# Patient Record
Sex: Female | Born: 1935 | Race: White | Hispanic: No | Smoking: Former smoker
Health system: Southern US, Community
[De-identification: ages and names within clinical notes are randomized; demographics above are authoritative.]

## PROBLEM LIST (undated history)

## (undated) DIAGNOSIS — L0390 Cellulitis, unspecified: Secondary | ICD-10-CM

## (undated) DIAGNOSIS — I10 Essential (primary) hypertension: Secondary | ICD-10-CM

## (undated) DIAGNOSIS — K219 Gastro-esophageal reflux disease without esophagitis: Secondary | ICD-10-CM

## (undated) DIAGNOSIS — I2510 Atherosclerotic heart disease of native coronary artery without angina pectoris: Secondary | ICD-10-CM

## (undated) DIAGNOSIS — I509 Heart failure, unspecified: Secondary | ICD-10-CM

## (undated) HISTORY — DX: Cellulitis, unspecified: L03.90

## (undated) HISTORY — PX: RIB FRACTURE SURGERY: SHX2358

## (undated) HISTORY — PX: CORONARY ARTERY BYPASS GRAFT: SHX141

---

## 1999-01-27 ENCOUNTER — Encounter: Admission: RE | Admit: 1999-01-27 | Discharge: 1999-03-23 | Payer: Self-pay | Admitting: Internal Medicine

## 1999-03-24 ENCOUNTER — Encounter: Admission: RE | Admit: 1999-03-24 | Discharge: 1999-06-22 | Payer: Self-pay | Admitting: Internal Medicine

## 1999-08-10 ENCOUNTER — Encounter: Admission: RE | Admit: 1999-08-10 | Discharge: 1999-11-06 | Payer: Self-pay | Admitting: Orthopedic Surgery

## 1999-11-06 ENCOUNTER — Encounter: Admission: RE | Admit: 1999-11-06 | Discharge: 2000-02-04 | Payer: Self-pay | Admitting: Orthopedic Surgery

## 2000-06-01 ENCOUNTER — Encounter: Admission: RE | Admit: 2000-06-01 | Discharge: 2000-08-30 | Payer: Self-pay | Admitting: Internal Medicine

## 2000-09-05 ENCOUNTER — Encounter: Admission: RE | Admit: 2000-09-05 | Discharge: 2000-12-04 | Payer: Self-pay | Admitting: Orthopedic Surgery

## 2001-01-24 ENCOUNTER — Encounter: Admission: RE | Admit: 2001-01-24 | Discharge: 2001-01-30 | Payer: Self-pay | Admitting: Orthopedic Surgery

## 2001-05-02 ENCOUNTER — Encounter: Admission: RE | Admit: 2001-05-02 | Discharge: 2001-05-15 | Payer: Self-pay | Admitting: Orthopedic Surgery

## 2001-08-08 ENCOUNTER — Encounter: Admission: RE | Admit: 2001-08-08 | Discharge: 2001-08-28 | Payer: Self-pay | Admitting: Orthopedic Surgery

## 2005-05-03 ENCOUNTER — Other Ambulatory Visit: Payer: Self-pay

## 2005-05-03 ENCOUNTER — Inpatient Hospital Stay: Payer: Self-pay | Admitting: Internal Medicine

## 2005-09-01 ENCOUNTER — Encounter (HOSPITAL_BASED_OUTPATIENT_CLINIC_OR_DEPARTMENT_OTHER): Admission: RE | Admit: 2005-09-01 | Discharge: 2005-10-21 | Payer: Self-pay | Admitting: Surgery

## 2005-09-09 ENCOUNTER — Inpatient Hospital Stay: Payer: Self-pay | Admitting: Internal Medicine

## 2005-09-09 ENCOUNTER — Other Ambulatory Visit: Payer: Self-pay

## 2005-09-10 ENCOUNTER — Other Ambulatory Visit: Payer: Self-pay

## 2005-10-05 ENCOUNTER — Ambulatory Visit: Payer: Self-pay | Admitting: Internal Medicine

## 2007-07-04 ENCOUNTER — Ambulatory Visit: Payer: Self-pay | Admitting: Family

## 2007-12-07 ENCOUNTER — Ambulatory Visit: Payer: Self-pay | Admitting: Family

## 2008-03-07 ENCOUNTER — Ambulatory Visit: Payer: Self-pay | Admitting: Family

## 2008-11-28 ENCOUNTER — Ambulatory Visit: Payer: Self-pay | Admitting: Family

## 2009-03-04 ENCOUNTER — Ambulatory Visit: Payer: Self-pay | Admitting: Family

## 2009-05-23 ENCOUNTER — Ambulatory Visit: Payer: Self-pay | Admitting: Family

## 2009-12-10 ENCOUNTER — Ambulatory Visit: Payer: Self-pay | Admitting: Family

## 2010-03-04 ENCOUNTER — Ambulatory Visit: Payer: Self-pay | Admitting: Family

## 2010-07-15 ENCOUNTER — Ambulatory Visit: Payer: Self-pay | Admitting: Family

## 2011-03-11 ENCOUNTER — Ambulatory Visit: Payer: Self-pay | Admitting: Family

## 2012-02-02 ENCOUNTER — Ambulatory Visit: Payer: Self-pay | Admitting: Family

## 2012-04-17 ENCOUNTER — Ambulatory Visit: Payer: Self-pay | Admitting: Internal Medicine

## 2012-06-27 ENCOUNTER — Encounter: Payer: Self-pay | Admitting: Cardiothoracic Surgery

## 2012-06-27 ENCOUNTER — Encounter: Payer: Self-pay | Admitting: Nurse Practitioner

## 2012-07-01 LAB — WOUND CULTURE

## 2012-07-23 ENCOUNTER — Encounter: Payer: Self-pay | Admitting: Nurse Practitioner

## 2012-07-23 ENCOUNTER — Encounter: Payer: Self-pay | Admitting: Cardiothoracic Surgery

## 2012-08-23 ENCOUNTER — Encounter: Payer: Self-pay | Admitting: Cardiothoracic Surgery

## 2012-08-23 ENCOUNTER — Encounter: Payer: Self-pay | Admitting: Nurse Practitioner

## 2012-09-23 ENCOUNTER — Encounter: Payer: Self-pay | Admitting: Cardiothoracic Surgery

## 2012-09-23 ENCOUNTER — Encounter: Payer: Self-pay | Admitting: Nurse Practitioner

## 2012-12-28 ENCOUNTER — Encounter: Payer: Self-pay | Admitting: Cardiothoracic Surgery

## 2012-12-28 ENCOUNTER — Encounter: Payer: Self-pay | Admitting: Nurse Practitioner

## 2013-01-21 ENCOUNTER — Encounter: Payer: Self-pay | Admitting: Nurse Practitioner

## 2013-01-21 ENCOUNTER — Encounter: Payer: Self-pay | Admitting: Cardiothoracic Surgery

## 2013-02-20 ENCOUNTER — Encounter: Payer: Self-pay | Admitting: Nurse Practitioner

## 2013-02-20 ENCOUNTER — Encounter: Payer: Self-pay | Admitting: Cardiothoracic Surgery

## 2015-03-24 DIAGNOSIS — L0390 Cellulitis, unspecified: Secondary | ICD-10-CM

## 2015-03-24 HISTORY — DX: Cellulitis, unspecified: L03.90

## 2015-04-10 ENCOUNTER — Other Ambulatory Visit: Payer: Self-pay | Admitting: Internal Medicine

## 2015-04-10 DIAGNOSIS — I82402 Acute embolism and thrombosis of unspecified deep veins of left lower extremity: Secondary | ICD-10-CM

## 2015-04-11 ENCOUNTER — Other Ambulatory Visit: Payer: Self-pay | Admitting: Internal Medicine

## 2015-04-11 ENCOUNTER — Ambulatory Visit
Admission: RE | Admit: 2015-04-11 | Discharge: 2015-04-11 | Disposition: A | Discharge status: Home / Self Care | Payer: Medicare Other | Source: Ambulatory Visit | Attending: Internal Medicine | Admitting: Internal Medicine

## 2015-04-11 DIAGNOSIS — M7989 Other specified soft tissue disorders: Secondary | ICD-10-CM

## 2015-04-11 DIAGNOSIS — L03116 Cellulitis of left lower limb: Secondary | ICD-10-CM | POA: Diagnosis not present

## 2015-04-11 DIAGNOSIS — I82402 Acute embolism and thrombosis of unspecified deep veins of left lower extremity: Secondary | ICD-10-CM

## 2015-04-11 DIAGNOSIS — L539 Erythematous condition, unspecified: Secondary | ICD-10-CM

## 2015-04-14 ENCOUNTER — Encounter: Payer: Self-pay | Admitting: Emergency Medicine

## 2015-04-14 ENCOUNTER — Inpatient Hospital Stay
Admission: EM | Admit: 2015-04-14 | Discharge: 2015-04-17 | DRG: 602 | Disposition: A | Discharge status: Home / Self Care | Payer: Medicare Other | Attending: Internal Medicine | Admitting: Internal Medicine

## 2015-04-14 DIAGNOSIS — I10 Essential (primary) hypertension: Secondary | ICD-10-CM | POA: Diagnosis present

## 2015-04-14 DIAGNOSIS — L03116 Cellulitis of left lower limb: Secondary | ICD-10-CM | POA: Diagnosis present

## 2015-04-14 DIAGNOSIS — Z8249 Family history of ischemic heart disease and other diseases of the circulatory system: Secondary | ICD-10-CM

## 2015-04-14 DIAGNOSIS — E785 Hyperlipidemia, unspecified: Secondary | ICD-10-CM | POA: Diagnosis present

## 2015-04-14 DIAGNOSIS — K219 Gastro-esophageal reflux disease without esophagitis: Secondary | ICD-10-CM | POA: Diagnosis present

## 2015-04-14 DIAGNOSIS — I2510 Atherosclerotic heart disease of native coronary artery without angina pectoris: Secondary | ICD-10-CM | POA: Diagnosis present

## 2015-04-14 DIAGNOSIS — Z87891 Personal history of nicotine dependence: Secondary | ICD-10-CM | POA: Diagnosis not present

## 2015-04-14 DIAGNOSIS — Z951 Presence of aortocoronary bypass graft: Secondary | ICD-10-CM | POA: Diagnosis not present

## 2015-04-14 DIAGNOSIS — Z79899 Other long term (current) drug therapy: Secondary | ICD-10-CM

## 2015-04-14 DIAGNOSIS — Z7982 Long term (current) use of aspirin: Secondary | ICD-10-CM | POA: Diagnosis not present

## 2015-04-14 DIAGNOSIS — L0390 Cellulitis, unspecified: Secondary | ICD-10-CM | POA: Diagnosis present

## 2015-04-14 DIAGNOSIS — R0602 Shortness of breath: Secondary | ICD-10-CM

## 2015-04-14 DIAGNOSIS — I5023 Acute on chronic systolic (congestive) heart failure: Secondary | ICD-10-CM | POA: Diagnosis present

## 2015-04-14 HISTORY — DX: Gastro-esophageal reflux disease without esophagitis: K21.9

## 2015-04-14 HISTORY — DX: Atherosclerotic heart disease of native coronary artery without angina pectoris: I25.10

## 2015-04-14 HISTORY — DX: Essential (primary) hypertension: I10

## 2015-04-14 HISTORY — DX: Heart failure, unspecified: I50.9

## 2015-04-14 LAB — BASIC METABOLIC PANEL
Anion gap: 8 (ref 5–15)
BUN: 33 mg/dL — AB (ref 6–20)
CO2: 25 mmol/L (ref 22–32)
CREATININE: 1.06 mg/dL — AB (ref 0.44–1.00)
Calcium: 9.1 mg/dL (ref 8.9–10.3)
Chloride: 112 mmol/L — ABNORMAL HIGH (ref 101–111)
GFR calc Af Amer: 51 mL/min — ABNORMAL LOW (ref >60)
GFR, EST NON AFRICAN AMERICAN: 44 mL/min — AB (ref >60)
Glucose, Bld: 99 mg/dL (ref 65–99)
Potassium: 4.1 mmol/L (ref 3.5–5.1)
SODIUM: 145 mmol/L (ref 135–145)

## 2015-04-14 LAB — URINALYSIS COMPLETE WITH MICROSCOPIC (ARMC ONLY)
BILIRUBIN URINE: NEGATIVE
Bacteria, UA: NONE SEEN
Glucose, UA: NEGATIVE mg/dL
HGB URINE DIPSTICK: NEGATIVE
LEUKOCYTES UA: NEGATIVE
NITRITE: NEGATIVE
PH: 6 (ref 5.0–8.0)
Protein, ur: NEGATIVE mg/dL
Specific Gravity, Urine: 1.023 (ref 1.005–1.030)

## 2015-04-14 LAB — CBC
HCT: 36 % (ref 35.0–47.0)
Hemoglobin: 11.6 g/dL — ABNORMAL LOW (ref 12.0–16.0)
MCH: 28 pg (ref 26.0–34.0)
MCHC: 32.3 g/dL (ref 32.0–36.0)
MCV: 86.9 fL (ref 80.0–100.0)
PLATELETS: 225 10*3/uL (ref 150–440)
RBC: 4.14 MIL/uL (ref 3.80–5.20)
RDW: 14.9 % — ABNORMAL HIGH (ref 11.5–14.5)
WBC: 5.9 10*3/uL (ref 3.6–11.0)

## 2015-04-14 MED ORDER — SIMVASTATIN 40 MG PO TABS
40.0000 mg | ORAL_TABLET | Freq: Every day | ORAL | Status: DC
  Administered 2015-04-14 – 2015-04-16 (×3): 40 mg via ORAL
  Filled 2015-04-14 (×3): qty 1

## 2015-04-14 MED ORDER — ENOXAPARIN SODIUM 40 MG/0.4ML ~~LOC~~ SOLN: 40.0000 mg | SUBCUTANEOUS | Status: DC

## 2015-04-14 MED ORDER — ASPIRIN EC 325 MG PO TBEC
325.0000 mg | DELAYED_RELEASE_TABLET | Freq: Every day | ORAL | Status: DC
  Administered 2015-04-14 – 2015-04-16 (×3): 325 mg via ORAL
  Filled 2015-04-14 (×3): qty 1

## 2015-04-14 MED ORDER — PANTOPRAZOLE SODIUM 40 MG PO TBEC
40.0000 mg | DELAYED_RELEASE_TABLET | Freq: Every day | ORAL | Status: DC
  Administered 2015-04-15 – 2015-04-17 (×3): 40 mg via ORAL
  Filled 2015-04-14 (×3): qty 1

## 2015-04-14 MED ORDER — LORATADINE 10 MG PO TABS
10.0000 mg | ORAL_TABLET | Freq: Every day | ORAL | Status: DC
  Administered 2015-04-15 – 2015-04-17 (×3): 10 mg via ORAL
  Filled 2015-04-14 (×3): qty 1

## 2015-04-14 MED ORDER — MORPHINE SULFATE (PF) 2 MG/ML IV SOLN
2.0000 mg | INTRAVENOUS | Status: DC | PRN
Start: 2015-04-14 — End: 2015-04-17
  Administered 2015-04-14 – 2015-04-16 (×4): 2 mg via INTRAVENOUS
  Filled 2015-04-14 (×4): qty 1

## 2015-04-14 MED ORDER — ACETAMINOPHEN 325 MG PO TABS: 650.0000 mg | ORAL_TABLET | Freq: Four times a day (QID) | ORAL | Status: DC | PRN

## 2015-04-14 MED ORDER — VANCOMYCIN HCL IN DEXTROSE 1-5 GM/200ML-% IV SOLN
1000.0000 mg | Freq: Once | INTRAVENOUS | Status: AC
  Administered 2015-04-14: 1000 mg via INTRAVENOUS
  Filled 2015-04-14 (×2): qty 200

## 2015-04-14 MED ORDER — ACETAMINOPHEN 650 MG RE SUPP: 650.0000 mg | Freq: Four times a day (QID) | RECTAL | Status: DC | PRN

## 2015-04-14 MED ORDER — QUINAPRIL HCL 10 MG PO TABS
20.0000 mg | ORAL_TABLET | Freq: Every day | ORAL | Status: DC
  Administered 2015-04-14 – 2015-04-16 (×3): 20 mg via ORAL
  Filled 2015-04-14 (×3): qty 2

## 2015-04-14 MED ORDER — ATENOLOL 50 MG PO TABS
50.0000 mg | ORAL_TABLET | Freq: Every day | ORAL | Status: DC
  Administered 2015-04-14 – 2015-04-16 (×3): 50 mg via ORAL
  Filled 2015-04-14 (×3): qty 1

## 2015-04-14 MED ORDER — FUROSEMIDE 20 MG PO TABS
20.0000 mg | ORAL_TABLET | Freq: Every day | ORAL | Status: DC
  Administered 2015-04-14: 20 mg via ORAL
  Filled 2015-04-14 (×2): qty 1

## 2015-04-14 MED ORDER — ENOXAPARIN SODIUM 30 MG/0.3ML ~~LOC~~ SOLN
30.0000 mg | SUBCUTANEOUS | Status: DC
  Administered 2015-04-14 – 2015-04-16 (×3): 30 mg via SUBCUTANEOUS
  Filled 2015-04-14 (×3): qty 0.3

## 2015-04-14 MED ORDER — POTASSIUM CHLORIDE CRYS ER 10 MEQ PO TBCR
10.0000 meq | EXTENDED_RELEASE_TABLET | Freq: Every day | ORAL | Status: DC
Start: 2015-04-14 — End: 2015-04-17
  Administered 2015-04-14 – 2015-04-16 (×3): 10 meq via ORAL
  Filled 2015-04-14 (×3): qty 1

## 2015-04-14 MED ORDER — SODIUM CHLORIDE 0.9 % IV SOLN
1.5000 g | Freq: Two times a day (BID) | INTRAVENOUS | Status: DC
  Administered 2015-04-14 – 2015-04-15 (×2): 1.5 g via INTRAVENOUS
  Filled 2015-04-14 (×4): qty 1.5

## 2015-04-14 MED ORDER — OXYCODONE HCL 5 MG PO TABS
5.0000 mg | ORAL_TABLET | ORAL | Status: DC | PRN
  Administered 2015-04-14 – 2015-04-16 (×7): 5 mg via ORAL
  Filled 2015-04-14 (×7): qty 1

## 2015-04-14 NOTE — Progress Notes (Signed)
ANTIBIOTIC CONSULT NOTE - INITIAL  Pharmacy Consult for Unasyn Indication: cellulitis   No Known Allergies  Patient Measurements: Height: 5' (152.4 cm) Weight: 108 lb (48.988 kg) IBW/kg (Calculated) : 45.5 Adjusted Body Weight:   Vital Signs: Temp: 98.2 F (36.8 C) (08/22 1838) Temp Source: Oral (08/22 1506) BP: 149/87 mmHg (08/22 1838) Pulse Rate: 105 (08/22 1838) Intake/Output from previous day:   Intake/Output from this shift:    Labs:  Recent Labs  04/14/15 1513  WBC 5.9  HGB 11.6*  PLT 225  CREATININE 1.06*   Estimated Creatinine Clearance: 23.8 mL/min (by C-G formula based on Cr of 1.06). No results for input(s): VANCOTROUGH, VANCOPEAK, VANCORANDOM, GENTTROUGH, GENTPEAK, GENTRANDOM, TOBRATROUGH, TOBRAPEAK, TOBRARND, AMIKACINPEAK, AMIKACINTROU, AMIKACIN in the last 72 hours.   Microbiology: No results found for this or any previous visit (from the past 720 hour(s)).  Medical History: Past Medical History  Diagnosis Date  . Hypertension   . GERD (gastroesophageal reflux disease)   . CHF (congestive heart failure)   . CAD (coronary artery disease)     Medications:  Scheduled:  . ampicillin-sulbactam (UNASYN) IV  1.5 g Intravenous Q12H  . aspirin EC  325 mg Oral QHS  . atenolol  50 mg Oral QHS  . enoxaparin (LOVENOX) injection  30 mg Subcutaneous Q24H  . furosemide  20 mg Oral QHS  . loratadine  10 mg Oral Daily  . pantoprazole  40 mg Oral Daily  . potassium chloride  10 mEq Oral QHS  . quinapril  20 mg Oral QHS  . simvastatin  40 mg Oral QHS   Assessment: Patient being treated for LLE cellulitis previously failing Levofloxacin. Blood cultures currently ordered.  Goal of Therapy:  Resolution of condition  Plan:  Will start Unasyn 1.5 g IV q12 hours.   Ashlea Dusing D 04/14/2015,6:48 PM

## 2015-04-14 NOTE — ED Notes (Signed)
Pt presents with left lower leg swelling and redness for one week and not getting any better. Redness and swelling noted to left lower leg.

## 2015-04-14 NOTE — H&P (Signed)
[facility] - [place] at [facility]   PATIENT NAME: [name] [name]    MR#:  [id number]  DATE OF BIRTH:  1920-03-29  DATE OF ADMISSION:  04/14/2015  PRIMARY CARE PHYSICIAN: Derwood Kaplan, MD   REQUESTING/REFERRING PHYSICIAN: Dr. Lenard Lance  CHIEF COMPLAINT:   Chief Complaint  Patient presents with  . Leg Swelling    HISTORY OF PRESENT ILLNESS:  [name] [name]  is a 81 y.o. female with a known history of CAD status post bypass graft surgery, congestive heart failure and hypertension presents to the hospital secondary to worsening left lower extremity cellulitis failing outpatient treatment. Patient has poor nail hygiene of both lower extremities. She noticed that her left leg was more swollen and red last week. Seen Dr. Maryellen Pile and was started on Levaquin. She went to see Dr. Juliann Pares 3 days ago for cardiac follow-up, and he did a Doppler lower extremity and there was no DVT. Patient was continued on the same antibiotics and her symptoms got first time that she couldn't even get out and presented to the hospital. Her labs are stable in the hospital. But she is being admitted for unresolving cellulitis on oral antibiotics.  PAST MEDICAL HISTORY:   Past Medical History  Diagnosis Date  . Hypertension   . GERD (gastroesophageal reflux disease)   . CHF (congestive heart failure)   . CAD (coronary artery disease)     PAST SURGICAL HISTORY:   Past Surgical History  Procedure Laterality Date  . Rib fracture surgery    . Coronary artery bypass graft      SOCIAL HISTORY:   Social History  Substance Use Topics  . Smoking status: Former Games developer  . Smokeless tobacco: Not on file  . Alcohol Use: No    FAMILY HISTORY:   Family History  Problem Relation Age of Onset  . Hypertension Mother   . CAD Father     DRUG ALLERGIES:  No Known Allergies  REVIEW OF SYSTEMS:   Review of Systems  Constitutional: Positive for malaise/fatigue. Negative for fever,  chills and weight loss.  HENT: Negative for ear discharge, ear pain, hearing loss, nosebleeds and tinnitus.   Eyes: Negative for blurred vision, double vision and photophobia.  Respiratory: Negative for cough, hemoptysis, shortness of breath and wheezing.   Cardiovascular: Positive for leg swelling. Negative for chest pain, palpitations and orthopnea.  Gastrointestinal: Negative for heartburn, nausea, vomiting, abdominal pain, diarrhea, constipation and melena.  Genitourinary: Negative for dysuria, urgency, frequency and hematuria.  Musculoskeletal: Positive for myalgias. Negative for back pain and neck pain.       Erythema and swelling of left lower extremity  Skin: Negative for rash.  Neurological: Negative for dizziness, tingling, tremors, sensory change, speech change, focal weakness and headaches.  Endo/Heme/Allergies: Does not bruise/bleed easily.  Psychiatric/Behavioral: Negative for depression.    MEDICATIONS AT HOME:   Prior to Admission medications   Medication Sig Start Date End Date Taking? Authorizing Provider  aspirin EC 325 MG tablet Take 325 mg by mouth at bedtime.   Yes Historical Provider, MD  atenolol (TENORMIN) 100 MG tablet Take 50 mg by mouth at bedtime.   Yes Historical Provider, MD  cetirizine (ZYRTEC) 10 MG tablet Take 10 mg by mouth at bedtime.   Yes Historical Provider, MD  furosemide (LASIX) 20 MG tablet Take 20 mg by mouth at bedtime.   Yes Historical Provider, MD  levofloxacin (LEVAQUIN) 500 MG tablet Take 500 mg by mouth daily. 04/07/15 04/16/15 Yes  Historical Provider, MD  omeprazole (PRILOSEC) 20 MG capsule Take 20 mg by mouth at bedtime.   Yes Historical Provider, MD  potassium chloride (K-DUR,KLOR-CON) 10 MEQ tablet Take 10 mEq by mouth at bedtime.   Yes Historical Provider, MD  quinapril (ACCUPRIL) 20 MG tablet Take 20 mg by mouth at bedtime.   Yes Historical Provider, MD  simvastatin (ZOCOR) 40 MG tablet Take 40 mg by mouth at bedtime.   Yes Historical  Provider, MD      VITAL SIGNS:  Blood pressure 147/87, pulse 94, temperature 98 F (36.7 C), temperature source Oral, resp. rate 19, height 5' (1.524 m), weight 48.988 kg (108 lb), SpO2 98 %.  PHYSICAL EXAMINATION:   Physical Exam  GENERAL:  81 y.o.-year-old patient lying in the bed with no acute distress.  EYES: Pupils equal, round, reactive to light and accommodation. No scleral icterus. Extraocular muscles intact.  HEENT: Head atraumatic, normocephalic. Oropharynx and nasopharynx clear.  NECK:  Supple, no jugular venous distention. No thyroid enlargement, no tenderness.  LUNGS: Normal breath sounds bilaterally, no wheezing, rhonchi or crepitation. No use of accessory muscles of respiration. Bibasilar crackles heard posteriorly. CARDIOVASCULAR: S1, S2 normal. No rubs, or gallops. 3/6 systolic murmur is present in the precordial region. ABDOMEN: Soft, nontender, nondistended. Bowel sounds present. No organomegaly or mass.  EXTREMITIES:2+ pedal edema of left leg and 1+ edema of the right foot noted., cyanosis, or clubbing.  Her left leg is erythematous and swollen. She has poor nail hygiene of both feet. NEUROLOGIC: Cranial nerves II through XII are intact. Muscle strength 5/5 in all extremities. Sensation intact. Gait not checked.  PSYCHIATRIC: The patient is alert and oriented x 3.  SKIN: No obvious rash, lesion, or ulcer.   LABORATORY PANEL:   CBC  Recent Labs Lab 04/14/15 1513  WBC 5.9  HGB 11.6*  HCT 36.0  PLT 225   ------------------------------------------------------------------------------------------------------------------  Chemistries   Recent Labs Lab 04/14/15 1513  NA 145  K 4.1  CL 112*  CO2 25  GLUCOSE 99  BUN 33*  CREATININE 1.06*  CALCIUM 9.1   ------------------------------------------------------------------------------------------------------------------  Cardiac Enzymes No results for input(s): TROPONINI in the last 168  hours. ------------------------------------------------------------------------------------------------------------------  RADIOLOGY:  No results found.  EKG:   Orders placed or performed in visit on 09/10/05  . EKG 12-Lead    IMPRESSION AND PLAN:   [name] [name]  is a 81 y.o. female with a known history of CAD status post bypass graft surgery, congestive heart failure and hypertension presents to the hospital secondary to worsening left lower extremity cellulitis failing outpatient treatment.  #1 left lower extremity cellulitis-failing outpatient treatment with Levaquin. -Recent Doppler in the office negative for any DVT -Blood cultures have been ordered. Started on Unasyn. -Unable to bear weight at this point so physical therapy consult. -On Lasix for her CHF, keep the leg elevated as well.  #2 CAD status post bypass graft surgery-stable, no active symptoms -Continue cardiac medications.  #3 hypertension-patient on quinapril, Lasix and atenolol.  #4 GERD-continue Protonix here.  #5 DVT prophylaxis-started on Lovenox.   All the records are reviewed and case discussed with ED provider. Management plans discussed with the patient, family and they are in agreement.  CODE STATUS: Full Code  TOTAL TIME TAKING CARE OF THIS PATIENT: 50 minutes.    Enid Baas M.D on 04/14/2015 at 5:44 PM  Between 7am to 6pm - Pager - [phone]  After 6pm go to [email or web address] - password [email or web address] [facility]  Office  [phone]  CC: Primary care physician; Derwood Kaplan, MD

## 2015-04-14 NOTE — ED Provider Notes (Signed)
[facility] Emergency Department Provider Note  Time seen: 4:13 PM  I have reviewed the triage vital signs and the nursing notes.   HISTORY  Chief Complaint Leg Swelling    HPI [name] [name] is a 81 y.o. female with a past medical history of hypertension, GERD, CHF who presents the emergency department with worsening left leg show any swelling. According to the patient for the past one week she has had left leg swelling and redness. She was seen by her primary care doctor (Dr. Maryellen Pile) who started the patient on Levaquin 1 week ago. Patient then had a routine follow-up with Dr. Juliann Pares, her cardiologist,who performed ultrasounds of the leg, which were negative for DVT. Patient has continued to take her antibiotics but states the redness continues to worsen and spread up her leg along with swelling and discomfort. Patient denies any fever, nausea, vomiting. Describes her pain as mild to moderate worse with walking in the left lower extremity.     Past Medical History  Diagnosis Date  . Hypertension   . GERD (gastroesophageal reflux disease)   . CHF (congestive heart failure)     There are no active problems to display for this patient.   Past Surgical History  Procedure Laterality Date  . Rib fracture surgery      No current outpatient prescriptions on file.  Allergies Review of patient's allergies indicates no known allergies.  No family history on file.  Social History Social History  Substance Use Topics  . Smoking status: Former Games developer  . Smokeless tobacco: None  . Alcohol Use: No    Review of Systems Constitutional: Negative for fever. Cardiovascular: Negative for chest pain. Respiratory: Negative for shortness of breath. Gastrointestinal: Negative for abdominal pain Skin: Positive for rash of left lower extremity along with swelling. Neurological: Negative for headache 10-point ROS otherwise  negative.  ____________________________________________   PHYSICAL EXAM:  VITAL SIGNS: ED Triage Vitals  Enc Vitals Group     BP 04/14/15 1506 141/70 mmHg     Pulse Rate 04/14/15 1506 93     Resp 04/14/15 1506 20     Temp 04/14/15 1506 98 F (36.7 C)     Temp Source 04/14/15 1506 Oral     SpO2 04/14/15 1506 99 %     Weight 04/14/15 1506 108 lb (48.988 kg)     Height 04/14/15 1506 5' (1.524 m)     Head Cir --      Peak Flow --      Pain Score --      Pain Loc --      Pain Edu? --      Excl. in GC? --     Constitutional: Alert and oriented. Well appearing and in no distress. Eyes: Normal exam ENT.   Mouth/Throat: Mucous membranes are moist. Cardiovascular: Normal rate, regular rhythm. Respiratory: Normal respiratory effort without tachypnea nor retractions. Breath sounds are clear and equal bilaterally. No wheezes/rales/rhonchi. Gastrointestinal: Soft and nontender. No distention.   Musculoskeletal: Patient with moderate tenderness of left lower extremity/left calf. Erythema extending from the foot up to the knee, 2+ edema in the left lower extremity, no edema noted in the right lower extremity. Neurologic:  Normal speech and language. No gross focal neurologic deficits Skin:  Skin is warm, dry and intact.  Psychiatric: Mood and affect are normal. Speech and behavior are normal.   ____________________________________________    INITIAL IMPRESSION / ASSESSMENT AND PLAN / ED COURSE  Pertinent labs &  imaging results that were available during my care of the patient were reviewed by me and considered in my medical decision making (see chart for details).  Patient with worsening left lower extremity redness and swelling. Patient has been on antibiotics for 1 week, with no improvement. Patient states the redness and swelling continues to worsen. She had negative venous ultrasounds performed 3 days ago, showing no DVT. Labs are largely within normal limits. Given the  patient's continued redness, swelling, pain, believe the patient has failed outpatient treatment and requires IV antibiotics. We will check blood cultures, begin IV vancomycin. The patient will be admitted to the hospital for further workup and evaluation.  ____________________________________________   FINAL CLINICAL IMPRESSION(S) / ED DIAGNOSES  Left lower external cellulitis   Minna Antis, MD 04/14/15 1620

## 2015-04-15 ENCOUNTER — Inpatient Hospital Stay: Payer: Medicare Other

## 2015-04-15 LAB — CBC
HEMATOCRIT: 33.4 % — AB (ref 35.0–47.0)
Hemoglobin: 10.7 g/dL — ABNORMAL LOW (ref 12.0–16.0)
MCH: 27.8 pg (ref 26.0–34.0)
MCHC: 31.9 g/dL — ABNORMAL LOW (ref 32.0–36.0)
MCV: 87 fL (ref 80.0–100.0)
PLATELETS: 190 10*3/uL (ref 150–440)
RBC: 3.84 MIL/uL (ref 3.80–5.20)
RDW: 15 % — ABNORMAL HIGH (ref 11.5–14.5)
WBC: 4.9 10*3/uL (ref 3.6–11.0)

## 2015-04-15 LAB — BASIC METABOLIC PANEL
ANION GAP: 7 (ref 5–15)
BUN: 32 mg/dL — ABNORMAL HIGH (ref 6–20)
CALCIUM: 8.4 mg/dL — AB (ref 8.9–10.3)
CO2: 25 mmol/L (ref 22–32)
CREATININE: 1.01 mg/dL — AB (ref 0.44–1.00)
Chloride: 112 mmol/L — ABNORMAL HIGH (ref 101–111)
GFR calc Af Amer: 54 mL/min — ABNORMAL LOW (ref >60)
GFR calc non Af Amer: 46 mL/min — ABNORMAL LOW (ref >60)
GLUCOSE: 100 mg/dL — AB (ref 65–99)
Potassium: 3.8 mmol/L (ref 3.5–5.1)
Sodium: 144 mmol/L (ref 135–145)

## 2015-04-15 MED ORDER — CEPHALEXIN 500 MG PO CAPS
500.0000 mg | ORAL_CAPSULE | Freq: Two times a day (BID) | ORAL | Status: DC
  Administered 2015-04-15 – 2015-04-17 (×4): 500 mg via ORAL
  Filled 2015-04-15 (×4): qty 1

## 2015-04-15 MED ORDER — FUROSEMIDE 10 MG/ML IJ SOLN
20.0000 mg | Freq: Every day | INTRAMUSCULAR | Status: DC
  Administered 2015-04-15 – 2015-04-17 (×3): 20 mg via INTRAVENOUS
  Filled 2015-04-15 (×3): qty 2

## 2015-04-15 NOTE — Progress Notes (Signed)
Pt. Alert and oriented. VSS. Pain in legs and feet controlled with meds per MAR. Pt. Has red yeasty looking rash under breasts bilateral. Pt. Given bath and powders applied. Left lower leg from knee down is red and hot to touch. Pt. Is having a great deal of pain in extremity and in feet bilateral. Pt. Kept comfortable throughout the night. Pills whole with water. Resting quietly.

## 2015-04-15 NOTE — Clinical Social Work Note (Signed)
Clinical Social Work Assessment  Patient Details  Name: [name] [name] MRN: [id number] Date of Birth: September 29, 1919  Date of referral:                  Reason for consult:  Family Concerns, Mental Health Concerns, Community Resources                Permission sought to share information with:  Case Manager, Family Supports Permission granted to share information::  Yes, Release of Information Signed  Name::      Reginia Forts  Agency::     Relationship::   Friend of patient  Contact Information:   [phone]  Housing/Transportation Living arrangements for the past 2 months:  [place] of Information:  Patient, Friend/Neighbor Patient Interpreter Needed:  None Criminal Activity/Legal Involvement Pertinent to Current Situation/Hospitalization:  No - Comment as needed Significant Relationships:  Siblings, Friend Lives with:  Adult Children Do you feel safe going back to the place where you live?  Yes Need for family participation in patient care:  No (Coment)  Care giving concerns: Patient is the caregiver for her two adult children that have mental health disorders.    Social Worker assessment / plan: Holiday representative (CSW) received referral that patient has social concerns. CSW met with patient alone at bedside. Patient was sitting in the chair and was alert and oriented. Patient reported that she lives in [place] with her son Isaac Bliss who has schizophrenia. Patient's daughter also has a mental health diagnosis and is at [facility] for rehab for an ankle fracture. Per patient her daughter will come live with her once she is done with rehab. PT is recommending home health. Patient is agreeable to going home. Patient expressed concerns that her son is home alone now. Patient is worried that he will not be able to have access to food. Patient reported that her church friends are helping her son now. Patient reported that her friend Rip Harbour helps with weekly grocery  shopping. Patient reported that she is concerned about what is going to happen to her children if something happens to her. Patient reported that they take care of each other the best they can. Patient reported that they usually have food and "make the best they can of things." Patient gave CSW permission to speak with Rip Harbour. CSW also gave patient a list of [employer] and [employer].  CSW spoke with Rip Harbour patient's friend. Per Rip Harbour the church is helping the patient and family as much as they can. Rip Harbour reported that she called DSS when patient went into the hospital. Per Rip Harbour patient's son has applied for [facility] services through [facility] and has not heard back yet. CSW also gave Rip Harbour the number for [place] Medicaid worker in [place].  CSW attempted multiple times to make an Adult Protective Services report however the line was busy. CSW will call APS tomorrow and attempt to make a report.       Employment status:  Retired, Disabled (Comment on whether or not currently receiving Disability) Insurance information:  Medicare PT Recommendations:  Home with [place] / Referral to community resources:  Other (Comment Required) [facility])  Patient/Family's Response to care: Patient is agreeable to going home with home health.   Patient/Family's Understanding of and Emotional Response to Diagnosis, Current Treatment, and Prognosis: Patient was pleasant throughout assessment and thanked CSW for visit. Patient also thanked CSW for providing resources.  Emotional Assessment Appearance:  Appears stated age Attitude/Demeanor/Rapport:    Affect (typically observed):  Accepting, Adaptable, Pleasant Orientation:  Oriented to Self, Oriented to Place, Oriented to  Time, Oriented to Situation Alcohol / Substance use:  Not Applicable Psych involvement (Current and /or in the community):  No (Comment)  Discharge Needs   Concerns to be addressed:  Discharge Planning Concerns Readmission within the last 30 days:  No Current discharge risk:  Inadequate Financial Supports Barriers to Discharge:  Continued Medical Work up   Loralyn Freshwater, LCSW 04/15/2015, 4:55 PM

## 2015-04-15 NOTE — Evaluation (Signed)
Physical Therapy Evaluation Patient Details Name: [name] [name] MRN: [id number] DOB: May 11, 1920 Today's Date: 04/15/2015   History of Present Illness  Pt is a 81 y.o. female admitted to hospital with worsening L LE cellulitis after failing OP treatment.  Clinical Impression  Currently pt demonstrates impairments with balance and limitations with functional mobility.  Prior to admission, pt was independent "furniture cruising" within the home and uses rollator in community.  Pt lives with family who currently are unable to assist pt (d/t their own medical issues) but pt's pastor and church members assist pt as needed.  Currently pt is CGA with transfers and ambulation with RW (distance limited d/t O2 desaturation from 91% at rest RA to 86% post ambulation on RA; returned to >91% on room air within a couple minutes).  Pt would benefit from skilled PT to address above noted impairments and functional limitations.  Recommend pt discharge to home with HHPT when medically appropriate; pt appears steady enough to use rollator within the home.     Follow Up Recommendations Home health PT    Equipment Recommendations       Recommendations for Other Services       Precautions / Restrictions Precautions Precautions: Fall Restrictions Weight Bearing Restrictions: No      Mobility  Bed Mobility               General bed mobility comments: Deferred d/t pt already sitting up in chair and pt reports she normally sleeps in a chair in her son's room (pt reports feeling confined in bed).  Transfers Overall transfer level: Needs assistance Equipment used: Rolling walker (2 wheeled) Transfers: Sit to/from Stand Sit to Stand: Min guard         General transfer comment: steady without loss of balance  Ambulation/Gait Ambulation/Gait assistance: Min guard Ambulation Distance (Feet): 45 Feet Assistive device: Rolling walker (2 wheeled) Gait Pattern/deviations: Step-through pattern Gait  velocity: mildly decreased   General Gait Details: trunk flexed d/t kyphotic posture  Stairs            Wheelchair Mobility    Modified Rankin (Stroke Patients Only)       Balance Overall balance assessment: Needs assistance Sitting-balance support: No upper extremity supported;Feet supported Sitting balance-Leahy Scale: Good     Standing balance support: Bilateral upper extremity supported Standing balance-Leahy Scale: Good                               Pertinent Vitals/Pain Pain Assessment: No/denies pain  HR 68-74 bpm during session.    Home Living Family/patient expects to be discharged to:: Private residence Living Arrangements: Children (pt's son (who requires assist and is unable to walk) and pt's daughter (who is currently at rehab d/t broken ankle)) Available Help at Discharge: Friend(s) (Pt's pastor and members of church come to assist pt) Type of Home: House Home Access: Ramped entrance     Home Layout: One level Home Equipment: Environmental consultant - 4 wheels;Bedside commode;Shower seat      Prior Function Level of Independence: Independent with assistive device(s);Needs assistance   Gait / Transfers Assistance Needed: pt "furniture cruises" in home and uses rollator in community; pt currently taking sponge baths (daughter used to assist her with showers but is gone to rehab currently)  ADL's / Homemaking Assistance Needed: pt's pastor and church members assist pt with grocery shopping, errands, dishes        Hand Dominance  Extremity/Trunk Assessment   Upper Extremity Assessment: Overall WFL for tasks assessed           Lower Extremity Assessment: Overall WFL for tasks assessed      Cervical / Trunk Assessment: Kyphotic  Communication   Communication: HOH  Cognition Arousal/Alertness: Awake/alert Behavior During Therapy: WFL for tasks assessed/performed Overall Cognitive Status: Within Functional Limits for tasks assessed                       General Comments General comments (skin integrity, edema, etc.): growth noted lateral aspect R foot  Nursing cleared pt for participation in physical therapy.  Pt agreeable to PT session.    Exercises        Assessment/Plan    PT Assessment Patient needs continued PT services  PT Diagnosis Difficulty walking   PT Problem List Cardiopulmonary status limiting activity;Decreased balance;Decreased mobility  PT Treatment Interventions DME instruction;Gait training;Functional mobility training;Therapeutic activities;Therapeutic exercise;Balance training;Patient/family education   PT Goals (Current goals can be found in the Care Plan section) Acute Rehab PT Goals Patient Stated Goal: To go home PT Goal Formulation: With patient Time For Goal Achievement: 04/29/15 Potential to Achieve Goals: Good    Frequency Min 2X/week   Barriers to discharge        Co-evaluation               End of Session Equipment Utilized During Treatment: Gait belt Activity Tolerance: Patient tolerated treatment well Patient left: in chair;with call bell/phone within reach;with chair alarm set Nurse Communication: Mobility status (O2 desaturation with activity)         Time: 6578-4696 PT Time Calculation (min) (ACUTE ONLY): 25 min   Charges:   PT Evaluation $Initial PT Evaluation Tier I: 1 Procedure     PT G CodesHendricks Limes 04/27/2015, 10:41 AM Hendricks Limes, PT [phone]

## 2015-04-15 NOTE — Progress Notes (Signed)
Patient ID: [name] [name], female   DOB: June 18, 1920, 81 y.o.   MRN: [id number] [facility] PROGRESS NOTE  PCP: Derwood Kaplan, MD  HPI/Subjective: Patient with the redness of the left lower extremity. This failed outpatient treatment with Levaquin. Today she is short of breath.  Objective: Filed Vitals:   04/15/15 0910  BP:   Pulse: 74  Temp:   Resp:     Filed Weights   04/14/15 1506  Weight: 48.988 kg (108 lb)    ROS: Review of Systems  Constitutional: Negative for fever and chills.  Eyes: Negative for blurred vision.  Respiratory: Positive for cough and shortness of breath.   Cardiovascular: Negative for chest pain.  Gastrointestinal: Negative for nausea, vomiting, abdominal pain, diarrhea and constipation.  Genitourinary: Negative for dysuria.  Musculoskeletal: Positive for joint pain.  Neurological: Negative for dizziness and headaches.   Exam: Physical Exam  Constitutional: She is oriented to person, place, and time.  HENT:  Nose: No mucosal edema.  Mouth/Throat: No oropharyngeal exudate or posterior oropharyngeal edema.  Eyes: Conjunctivae, EOM and lids are normal. Pupils are equal, round, and reactive to light.  Neck: No JVD present. Carotid bruit is not present. No edema present. No thyroid mass and no thyromegaly present.  Cardiovascular: S1 normal and S2 normal.  Exam reveals no gallop.   Murmur heard.  Systolic murmur is present with a grade of 2/6  Pulses:      Dorsalis pedis pulses are 2+ on the right side, and 2+ on the left side.  Respiratory: Accessory muscle usage present. No respiratory distress. She has no wheezes. She has no rhonchi. She has rales in the right lower field and the left lower field.  GI: Soft. Bowel sounds are normal. She exhibits distension. There is no tenderness.  Musculoskeletal:       Right ankle: She exhibits swelling.       Left ankle: She exhibits swelling.  Lymphadenopathy:    She has no cervical  adenopathy.  Neurological: She is alert and oriented to person, place, and time. No cranial nerve deficit.  Skin: Skin is warm. No rash noted. Nails show no clubbing.  Circumferential erythema left leg entire left leg down into the foot.  Psychiatric: She has a normal mood and affect.    Data Reviewed: Basic Metabolic Panel:  Recent Labs [name] 04/14/15 1513 04/15/15 0533  NA 145 144  K 4.1 3.8  CL 112* 112*  CO2 25 25  GLUCOSE 99 100*  BUN 33* 32*  CREATININE 1.06* 1.01*  CALCIUM 9.1 8.4*   CBC:  Recent Labs [name] 04/14/15 1513 04/15/15 0533  WBC 5.9 4.9  HGB 11.6* 10.7*  HCT 36.0 33.4*  MCV 86.9 87.0  PLT 225 190     Recent Results (from the past 240 hour(s))  Blood culture (routine x 2)     Status: None (Preliminary result)   Collection Time: 04/14/15  4:26 PM  Result Value Ref Range Status   Specimen Description BLOOD  Final   Special Requests   Final    BOTTLES DRAWN AEROBIC AND ANAEROBIC 1CC AEROBIC,1CC ANAEROBIC   Culture NO GROWTH < 24 HOURS  Final   Report Status PENDING  Incomplete  Blood culture (routine x 2)     Status: None (Preliminary result)   Collection Time: 04/14/15  4:26 PM  Result Value Ref Range Status   Specimen Description BLOOD  Final   Special Requests   Final    BOTTLES  DRAWN AEROBIC AND ANAEROBIC 2CC AEROBIC,2CC ANAEROBIC   Culture NO GROWTH < 24 HOURS  Final   Report Status PENDING  Incomplete     Scheduled Meds: . ampicillin-sulbactam (UNASYN) IV  1.5 g Intravenous Q12H  . aspirin EC  325 mg Oral QHS  . atenolol  50 mg Oral QHS  . enoxaparin (LOVENOX) injection  30 mg Subcutaneous Q24H  . furosemide  20 mg Intravenous Daily  . loratadine  10 mg Oral Daily  . pantoprazole  40 mg Oral Daily  . potassium chloride  10 mEq Oral QHS  . quinapril  20 mg Oral QHS  . simvastatin  40 mg Oral QHS    Assessment/Plan:  1. Acute on chronic systolic congestive heart failure. EF 40% on outpatient echo. We'll give Lasix 20 mg IV now and  daily. I will get a chest x-ray. Patient is on quinapril and atenolol. 2. Cellulitis left lower extremity- patient was placed on IV Unasyn and I will continue and reevaluate on a daily basis. 3. Gastroesophageal reflux disease without esophagitis on Protonix 4. Hyperlipidemia unspecified on simvastatin.  Code Status:     Code Status Orders        Start     Ordered   04/14/15 1836  Full code   Continuous     04/14/15 1835     Disposition Plan: Home with home health  Antibiotics:  Unasyn  Time spent: 25 minutes  Alford Highland  [facility] Hospitalists
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
## 2015-04-16 LAB — BASIC METABOLIC PANEL
ANION GAP: 6 (ref 5–15)
BUN: 28 mg/dL — ABNORMAL HIGH (ref 6–20)
CO2: 26 mmol/L (ref 22–32)
Calcium: 8.4 mg/dL — ABNORMAL LOW (ref 8.9–10.3)
Chloride: 108 mmol/L (ref 101–111)
Creatinine, Ser: 0.85 mg/dL (ref 0.44–1.00)
GFR calc Af Amer: >60 mL/min (ref >60)
GFR, EST NON AFRICAN AMERICAN: 57 mL/min — AB (ref >60)
GLUCOSE: 97 mg/dL (ref 65–99)
POTASSIUM: 4 mmol/L (ref 3.5–5.1)
Sodium: 140 mmol/L (ref 135–145)

## 2015-04-16 NOTE — Progress Notes (Signed)
Clinical Child psychotherapist (CSW) made an Adult Management consultant (APS) report this morning in [place]. Plan is for patient to D/C home. RN Case Manager aware of above. Please reconsult if future social work needs arise. CSW signing off.   Jetta Lout, LCSWA [phone]

## 2015-04-16 NOTE — Progress Notes (Signed)
Clinical Child psychotherapist (CSW) attempted to call a report in to APS 3 times this morning and the line was busy. CSW will continue to call APS to make a report. CSW will continue to follow and assist as needed.   Jetta Lout, LCSWA [phone]

## 2015-04-16 NOTE — Progress Notes (Signed)
Patient ID: [name] [name], female   DOB: 1919-09-01, 81 y.o.   MRN: [id number] [facility] Physicians PROGRESS NOTE  PCP: Derwood Kaplan, MD  HPI/Subjective: Patient feeling okay. Her leg looks a lot better. Still feeling short of breath. Worse with movement.  Objective: Filed Vitals:   04/16/15 0757  BP: 116/71  Pulse: 60  Temp: 97.6 F (36.4 C)  Resp: 18    Filed Weights   04/14/15 1506  Weight: 48.988 kg (108 lb)    ROS: Review of Systems  Constitutional: Negative for fever and chills.  Eyes: Negative for blurred vision.  Respiratory: Positive for cough and shortness of breath.   Cardiovascular: Negative for chest pain.  Gastrointestinal: Negative for nausea, vomiting, abdominal pain, diarrhea and constipation.  Genitourinary: Negative for dysuria.  Musculoskeletal: Positive for joint pain.  Neurological: Negative for dizziness and headaches.   Exam: Physical Exam  Constitutional: She is oriented to person, place, and time.  HENT:  Nose: No mucosal edema.  Mouth/Throat: No oropharyngeal exudate or posterior oropharyngeal edema.  Eyes: Conjunctivae, EOM and lids are normal. Pupils are equal, round, and reactive to light.  Neck: No JVD present. Carotid bruit is not present. No edema present. No thyroid mass and no thyromegaly present.  Cardiovascular: S1 normal and S2 normal.  Exam reveals no gallop.   Murmur heard.  Systolic murmur is present with a grade of 2/6  Pulses:      Dorsalis pedis pulses are 2+ on the right side, and 2+ on the left side.  Respiratory: Accessory muscle usage present. No respiratory distress. She has no wheezes. She has no rhonchi. She has rales in the right lower field and the left lower field.  GI: Soft. Bowel sounds are normal. She exhibits distension. There is no tenderness.  Musculoskeletal:       Right ankle: She exhibits swelling.       Left ankle: She exhibits swelling.  Lymphadenopathy:    She has no cervical adenopathy.   Neurological: She is alert and oriented to person, place, and time. No cranial nerve deficit.  Skin: Skin is warm. No rash noted. Nails show no clubbing.  Circumferential erythema left leg entire left leg down into the foot. This has faded quite a bit since yesterday and has improved.  Psychiatric: She has a normal mood and affect.    Data Reviewed: Basic Metabolic Panel:  Recent Labs [name] 04/14/15 1513 04/15/15 0533 04/16/15 0453  NA 145 144 140  K 4.1 3.8 4.0  CL 112* 112* 108  CO2 25 25 26   GLUCOSE 99 100* 97  BUN 33* 32* 28*  CREATININE 1.06* 1.01* 0.85  CALCIUM 9.1 8.4* 8.4*   CBC:  Recent Labs [name] 04/14/15 1513 04/15/15 0533  WBC 5.9 4.9  HGB 11.6* 10.7*  HCT 36.0 33.4*  MCV 86.9 87.0  PLT 225 190     Recent Results (from the past 240 hour(s))  Blood culture (routine x 2)     Status: None (Preliminary result)   Collection Time: 04/14/15  4:26 PM  Result Value Ref Range Status   Specimen Description BLOOD  Final   Special Requests   Final    BOTTLES DRAWN AEROBIC AND ANAEROBIC 1CC AEROBIC,1CC ANAEROBIC   Culture NO GROWTH 2 DAYS  Final   Report Status PENDING  Incomplete  Blood culture (routine x 2)     Status: None (Preliminary result)   Collection Time: 04/14/15  4:26 PM  Result Value Ref Range  Status   Specimen Description BLOOD  Final   Special Requests   Final    BOTTLES DRAWN AEROBIC AND ANAEROBIC 2CC AEROBIC,2CC ANAEROBIC   Culture NO GROWTH 2 DAYS  Final   Report Status PENDING  Incomplete     Scheduled Meds: . aspirin EC  325 mg Oral QHS  . atenolol  50 mg Oral QHS  . cephALEXin  500 mg Oral Q12H  . enoxaparin (LOVENOX) injection  30 mg Subcutaneous Q24H  . furosemide  20 mg Intravenous Daily  . loratadine  10 mg Oral Daily  . pantoprazole  40 mg Oral Daily  . potassium chloride  10 mEq Oral QHS  . quinapril  20 mg Oral QHS  . simvastatin  40 mg Oral QHS    Assessment/Plan:  1. Acute on chronic systolic congestive heart failure. EF  40% on outpatient echo. We'll give Lasix 20 mg daily. Patient  chest x-ray consistent with heart failure. Patient is on quinapril and atenolol. likely discharge tomorrow with home health  2. Cellulitis left lower extremity- much improved and patient is on by mouth Keflex. 3. Gastroesophageal reflux disease without esophagitis on Protonix 4. Hyperlipidemia unspecified on simvastatin. 5.  weakness physical therapy recommended home with home health  Code Status:     Code Status Orders        Start     Ordered   04/14/15 1836  Full code   Continuous     04/14/15 1835     Disposition Plan: Home with home healthlikely tomorrow   Antibiotics:  Keflex   Time spent: 20 minutes  Alford Highland  [facility] Hospitalists

## 2015-04-16 NOTE — Progress Notes (Signed)
Physical Therapy Treatment Patient Details Name: [name] [name] MRN: [id number] DOB: [birth year] Today's Date: 04/16/2015    History of Present Illness Pt is a 81 y.o. female admitted to hospital with worsening L LE cellulitis after failing OP treatment.    PT Comments    Pt continues to demonstrate increased SOB with activity (unable to get O2 saturations immediately post ambulation d/t pt's fingers very cold but O2 sats eventually reading 92% on room air) and requires pacing.  Discussed pacing/energy conservation techniques with pt and pt appears to understand them well and has been practicing them at home.  Pt appears steady with functional mobility using RW and anticipate pt will be able to go home ambulating household distances with walker and utilizing pacing/energy conservations techniques.  Recommend HHPT and prior level of assist pt was receiving.  Follow Up Recommendations  Home health PT     Equipment Recommendations       Recommendations for Other Services       Precautions / Restrictions Precautions Precautions: Fall Restrictions Weight Bearing Restrictions: No    Mobility  Bed Mobility Overal bed mobility: Needs Assistance Bed Mobility: Supine to Sit     Supine to sit: Supervision     General bed mobility comments: use of siderail and increased time to perform (pt reports she normally sleeps in chair at home)  Transfers Overall transfer level: Needs assistance Equipment used: Rolling walker (2 wheeled) Transfers: Sit to/from Stand Sit to Stand: Supervision;Min guard (x2 trials (pt required increased time to stand but was able to perform on her own))         General transfer comment: steady without loss of balance  Ambulation/Gait Ambulation/Gait assistance: Min guard Ambulation Distance (Feet): 45 Feet Assistive device: Rolling walker (2 wheeled) Gait Pattern/deviations: Step-through pattern Gait velocity: good cadence today (compared to  yesterday)   General Gait Details: trunk flexed d/t kyphotic posture   Stairs            Wheelchair Mobility    Modified Rankin (Stroke Patients Only)       Balance Overall balance assessment: Needs assistance Sitting-balance support: Feet unsupported;Bilateral upper extremity supported Sitting balance-Leahy Scale: Good Sitting balance - Comments: initially appeared to have good balance sitting but with extended perior of time sitting on edge of bed (>5 minutes) pt began to fatigue and lean more posterior (therapist assist to shift weight forward but then pt SBA again)   Standing balance support: Bilateral upper extremity supported (on RW) Standing balance-Leahy Scale: Good                      Cognition Arousal/Alertness: Awake/alert Behavior During Therapy: WFL for tasks assessed/performed Overall Cognitive Status: Within Functional Limits for tasks assessed                      Exercises      General Comments General comments (skin integrity, edema, etc.): decreased redness noted L LE  Nursing cleared pt for participation in physical therapy.  Pt agreeable to PT session.      Pertinent Vitals/Pain Pain Assessment: No/denies pain  91% O2 sats pre-ambulation on room air.    Home Living                      Prior Function            PT Goals (current goals can now be found in the care plan section) Acute Rehab  PT Goals Patient Stated Goal: To go home PT Goal Formulation: With patient Time For Goal Achievement: 04/29/15 Potential to Achieve Goals: Good Progress towards PT goals: Progressing toward goals    Frequency  Min 2X/week    PT Plan Current plan remains appropriate    Co-evaluation             End of Session Equipment Utilized During Treatment: Gait belt Activity Tolerance: Other (comment) (Limited d/t SOB with activity) Patient left: in chair;with call bell/phone within reach;with chair alarm set      Time: 1610-9604 PT Time Calculation (min) (ACUTE ONLY): 27 min  Charges:  $Therapeutic Exercise: 8-22 mins $Therapeutic Activity: 8-22 mins                    G CodesHendricks Limes 05-15-15, 10:58 AM Hendricks Limes, PT [phone]

## 2015-04-16 NOTE — Care Management Important Message (Signed)
Important Message  Patient Details  Name: [name] [name] MRN: [id number] Date of Birth: 07/21/1920   Medicare Important Message Given:  Yes-second notification given    Verita Schneiders [place] 04/16/2015, 10:11 AM

## 2015-04-17 LAB — BASIC METABOLIC PANEL
ANION GAP: 7 (ref 5–15)
BUN: 27 mg/dL — ABNORMAL HIGH (ref 6–20)
CALCIUM: 8.4 mg/dL — AB (ref 8.9–10.3)
CO2: 28 mmol/L (ref 22–32)
Chloride: 105 mmol/L (ref 101–111)
Creatinine, Ser: 0.89 mg/dL (ref 0.44–1.00)
GFR, EST NON AFRICAN AMERICAN: 54 mL/min — AB (ref >60)
GLUCOSE: 92 mg/dL (ref 65–99)
POTASSIUM: 4.2 mmol/L (ref 3.5–5.1)
SODIUM: 140 mmol/L (ref 135–145)

## 2015-04-17 MED ORDER — NYSTATIN 100000 UNIT/GM EX POWD
Freq: Two times a day (BID) | CUTANEOUS | Status: DC
  Administered 2015-04-17: 10:00:00 via TOPICAL
  Filled 2015-04-17: qty 15

## 2015-04-17 MED ORDER — FUROSEMIDE 20 MG PO TABS: 20.0000 mg | ORAL_TABLET | Freq: Every day | ORAL | Status: AC

## 2015-04-17 MED ORDER — CEPHALEXIN 500 MG PO CAPS: 500.0000 mg | ORAL_CAPSULE | Freq: Two times a day (BID) | ORAL | Status: DC

## 2015-04-17 MED ORDER — FLUCONAZOLE IN SODIUM CHLORIDE 100-0.9 MG/50ML-% IV SOLN: 100.0000 mg | Freq: Once | INTRAVENOUS | Status: DC

## 2015-04-17 MED ORDER — FLUCONAZOLE 100 MG PO TABS
100.0000 mg | ORAL_TABLET | Freq: Once | ORAL | Status: AC
  Administered 2015-04-17: 100 mg via ORAL
  Filled 2015-04-17: qty 1

## 2015-04-17 NOTE — Discharge Summary (Signed)
[facility] Physicians -  at [facility]   PATIENT NAME: [name] [name]    MR#:  [id number]  DATE OF BIRTH:  21-Sep-1919  DATE OF ADMISSION:  04/14/2015 ADMITTING PHYSICIAN: Enid Baas, MD  DATE OF DISCHARGE: 04/17/2015  PRIMARY CARE PHYSICIAN: Derwood Kaplan, MD    ADMISSION DIAGNOSIS:  Cellulitis of left lower extremity [L03.116]  DISCHARGE DIAGNOSIS:  Active Problems:   Cellulitis   SECONDARY DIAGNOSIS:   Past Medical History  Diagnosis Date  . Hypertension   . GERD (gastroesophageal reflux disease)   . CHF (congestive heart failure)   . CAD (coronary artery disease)     HOSPITAL COURSE:   1. Acute on chronic systolic congestive heart failure- patient was diuresed with IV Lasix for a few days. Switched over to oral Lasix upon discharge home. Patient is already on beta blocker and ACE inhibitor. 2. Left lower extremity cellulitis failed outpatient treatment with Levaquin. Patient was started on IV Unasyn switched over to by mouth Keflex and will be discharged home on a course of Keflex. 3. Essential hypertension blood pressure stable 4. Gastroesophageal reflux disease without esophagitis 5. History of coronary artery disease 6. Weakness- PT recommended home health  DISCHARGE CONDITIONS:   Satisfactory  CONSULTS OBTAINED:  Physical therapy  DRUG ALLERGIES:  No Known Allergies  DISCHARGE MEDICATIONS:   Current Discharge Medication List    START taking these medications   Details  cephALEXin (KEFLEX) 500 MG capsule Take 1 capsule (500 mg total) by mouth every 12 (twelve) hours. Qty: 16 capsule, Refills: 0      CONTINUE these medications which have CHANGED   Details  furosemide (LASIX) 20 MG tablet Take 1 tablet (20 mg total) by mouth daily. Qty: 30 tablet, Refills: 0      CONTINUE these medications which have NOT CHANGED   Details  aspirin EC 325 MG tablet Take 325 mg by mouth at bedtime.    atenolol (TENORMIN) 100 MG  tablet Take 50 mg by mouth at bedtime.    cetirizine (ZYRTEC) 10 MG tablet Take 10 mg by mouth at bedtime.    omeprazole (PRILOSEC) 20 MG capsule Take 20 mg by mouth at bedtime.    potassium chloride (K-DUR,KLOR-CON) 10 MEQ tablet Take 10 mEq by mouth at bedtime.    quinapril (ACCUPRIL) 20 MG tablet Take 20 mg by mouth at bedtime.    simvastatin (ZOCOR) 40 MG tablet Take 40 mg by mouth at bedtime.      STOP taking these medications     levofloxacin (LEVAQUIN) 500 MG tablet          DISCHARGE INSTRUCTIONS:    Follow-up CHF clinic Follow-up at Dr. Maryellen Pile 1 week  If you experience worsening of your admission symptoms, develop shortness of breath, life threatening emergency, suicidal or homicidal thoughts you must seek medical attention immediately by calling 911 or calling your MD immediately  if symptoms less severe.  You Must read complete instructions/literature along with all the possible adverse reactions/side effects for all the Medicines you take and that have been prescribed to you. Take any new Medicines after you have completely understood and accept all the possible adverse reactions/side effects.   Please note  You were cared for by a hospitalist during your hospital stay. If you have any questions about your discharge medications or the care you received while you were in the hospital after you are discharged, you can call the unit and asked to speak with the hospitalist on  call if the hospitalist that took care of you is not available. Once you are discharged, your primary care physician will handle any further medical issues. Please note that NO REFILLS for any discharge medications will be authorized once you are discharged, as it is imperative that you return to your primary care physician (or establish a relationship with a primary care physician if you do not have one) for your aftercare needs so that they can reassess your need for medications and monitor your lab  values.    Today   CHIEF COMPLAINT:   Chief Complaint  Patient presents with  . Leg Swelling    HISTORY OF PRESENT ILLNESS:  [name] [name]  is a 81 y.o. female with a known history of CHF, CAD presented with left lower extremity cellulitis that failed outpatient treatment. Also she was short of breath.   VITAL SIGNS:  Blood pressure 154/54, pulse 61, temperature 97.6 F (36.4 C), temperature source Oral, resp. rate 18, height 5' (1.524 m), weight 48.988 kg (108 lb), SpO2 92 %.    PHYSICAL EXAMINATION:  GENERAL:  81 y.o.-year-old patient lying in the bed with no acute distress.  EYES: Pupils equal, round, reactive to light and accommodation. No scleral icterus. Extraocular muscles intact.  HEENT: Head atraumatic, normocephalic. Oropharynx and nasopharynx clear.  NECK:  Supple, no jugular venous distention. No thyroid enlargement, no tenderness.  LUNGS: Decreased breath sounds bilaterally bases, no wheezing, rales,rhonchi or crepitation. No use of accessory muscles of respiration.  CARDIOVASCULAR: S1, S2 normal. 2/6 systolic murmur, no rubs, or gallops.  ABDOMEN: Soft, non-tender, non-distended. Bowel sounds present. No organomegaly or mass.  EXTREMITIES: No pedal edema, cyanosis, or clubbing.  NEUROLOGIC: Cranial nerves II through XII are intact. Muscle strength 5/5 in all extremities. Sensation intact. Gait not checked.  PSYCHIATRIC: The patient is alert and oriented x 3.  SKIN: Erythema left leg is starting to fade still with some warmth.  DATA REVIEW:   CBC  Recent Labs Lab 04/15/15 0533  WBC 4.9  HGB 10.7*  HCT 33.4*  PLT 190    Chemistries   Recent Labs Lab 04/17/15 0628  NA 140  K 4.2  CL 105  CO2 28  GLUCOSE 92  BUN 27*  CREATININE 0.89  CALCIUM 8.4*     Microbiology Results  Results for orders placed or performed during the hospital encounter of 04/14/15  Blood culture (routine x 2)     Status: None (Preliminary result)   Collection Time:  04/14/15  4:26 PM  Result Value Ref Range Status   Specimen Description BLOOD  Final   Special Requests   Final    BOTTLES DRAWN AEROBIC AND ANAEROBIC 1CC AEROBIC,1CC ANAEROBIC   Culture NO GROWTH 3 DAYS  Final   Report Status PENDING  Incomplete  Blood culture (routine x 2)     Status: None (Preliminary result)   Collection Time: 04/14/15  4:26 PM  Result Value Ref Range Status   Specimen Description BLOOD  Final   Special Requests   Final    BOTTLES DRAWN AEROBIC AND ANAEROBIC 2CC AEROBIC,2CC ANAEROBIC   Culture NO GROWTH 3 DAYS  Final   Report Status PENDING  Incomplete    RADIOLOGY:  Dg Chest Port 1 View  04/15/2015   CLINICAL DATA:  Shortness of breath. Worsening lower extremity cellulitis not responding to outpatient treatment. Former smoker.  EXAM: PORTABLE CHEST - 1 VIEW  COMPARISON:  None.  FINDINGS: Sternotomy wires are present. Patient slightly rotated to  the left. Lungs are hypoinflated with moderate opacification in the left base/retrocardiac region which may be due to an effusion with atelectasis although cannot exclude infection. Mild right basilar opacification which again may be due to a small amount of right pleural fluid/ atelectasis versus infection. There is mild cardiomegaly. There is mild curvature of the thoracic spine convex to the right. There are degenerative changes of the spine.  IMPRESSION: Bibasilar opacification left worse than right which may be due to bilateral effusions with atelectasis, although cannot exclude infection. Recommend followup chest radiograph in 3-4 weeks post treatment.   Electronically Signed   By: Elberta Fortis M.D.   On: 04/15/2015 12:29    Management plans discussed with the patient, family and they are in agreement.  CODE STATUS:     Code Status Orders        Start     Ordered   04/14/15 1836  Full code   Continuous     04/14/15 1835      TOTAL TIME TAKING CARE OF THIS PATIENT: 35 minutes.    Alford Highland M.D on  04/17/2015 at 8:10 AM  Between 7am to 6pm - Pager - [phone]  After 6pm go to [email or web address] - password [email or web address] [facility]  [facility] [place] [facility]  Office  [phone]  CC: Primary care physician; Derwood Kaplan, MD  Cardiologist Dr. Juliann Pares

## 2015-04-17 NOTE — Progress Notes (Signed)
RN SPOKE TO DR Renae Gloss . PT WITH SIGNIFICANT YEAST UNDERNEATH BREAST . DIFLUCAN   PO NOW AND NYSTATIN  POWDER BID

## 2015-04-17 NOTE — Discharge Instructions (Signed)
Heart Failure Clinic appointment on April 25, 2015 at 1:00pm with Clarisa Kindred, FNP. Please call [phone] to reschedule.

## 2015-04-17 NOTE — Progress Notes (Signed)
Spoke with patient regarding her upcoming appointment at the outpatient Heart Failure Clinic on April 25, 2015 at 1:00pm. Written information was also given with address and telephone number to the clinic. Thank you for the referral.

## 2015-04-17 NOTE — Care Management (Signed)
Patient discharging home today. I have requested MD to add Child psychotherapist and he agrees. I have notified [facility] of patient discharge home today. No further RNCM needs. Case closed.

## 2015-04-19 LAB — CULTURE, BLOOD (ROUTINE X 2)
Culture: NO GROWTH
Culture: NO GROWTH

## 2015-04-25 ENCOUNTER — Ambulatory Visit: Payer: Medicare Other | Attending: Family | Admitting: Family

## 2015-04-25 ENCOUNTER — Encounter: Payer: Self-pay | Admitting: Family

## 2015-04-25 VITALS — BP 130/70 | HR 72 | Resp 20 | Ht 60.0 in | Wt 113.0 lb

## 2015-04-25 DIAGNOSIS — Z7982 Long term (current) use of aspirin: Secondary | ICD-10-CM | POA: Insufficient documentation

## 2015-04-25 DIAGNOSIS — I2510 Atherosclerotic heart disease of native coronary artery without angina pectoris: Secondary | ICD-10-CM | POA: Insufficient documentation

## 2015-04-25 DIAGNOSIS — I5022 Chronic systolic (congestive) heart failure: Secondary | ICD-10-CM | POA: Insufficient documentation

## 2015-04-25 DIAGNOSIS — I10 Essential (primary) hypertension: Secondary | ICD-10-CM | POA: Diagnosis not present

## 2015-04-25 DIAGNOSIS — R0602 Shortness of breath: Secondary | ICD-10-CM | POA: Insufficient documentation

## 2015-04-25 DIAGNOSIS — K219 Gastro-esophageal reflux disease without esophagitis: Secondary | ICD-10-CM | POA: Diagnosis not present

## 2015-04-25 DIAGNOSIS — Z87891 Personal history of nicotine dependence: Secondary | ICD-10-CM | POA: Insufficient documentation

## 2015-04-25 DIAGNOSIS — L03116 Cellulitis of left lower limb: Secondary | ICD-10-CM | POA: Insufficient documentation

## 2015-04-25 DIAGNOSIS — I999 Unspecified disorder of circulatory system: Secondary | ICD-10-CM | POA: Insufficient documentation

## 2015-04-25 DIAGNOSIS — Z79899 Other long term (current) drug therapy: Secondary | ICD-10-CM | POA: Insufficient documentation

## 2015-04-25 NOTE — Progress Notes (Signed)
Subjective:    Patient ID: [name] [name], female    DOB: [birth year], 81 y.o.   MRN: [id number]  Congestive Heart Failure Presents for initial visit. The disease course has been stable. Associated symptoms include edema, fatigue, palpitations and shortness of breath. Pertinent negatives include no abdominal pain, chest pain, chest pressure or orthopnea. The symptoms have been stable. Past treatments include ACE inhibitors, beta blockers and salt and fluid restriction. The treatment provided moderate relief. Compliance with prior treatments has been good. Her past medical history is significant for CAD. There is no history of CVA or HTN. She has one 1st degree relative with heart disease. Compliance with total regimen is 76-100%.  Other This is a chronic (edema) problem. The current episode started more than 1 year ago. The problem occurs constantly. The problem has been gradually improving. Associated symptoms include fatigue. Pertinent negatives include no abdominal pain, chest pain, congestion, coughing, headaches, neck pain, numbness, rash or sore throat. The symptoms are aggravated by walking and standing. She has tried position changes for the symptoms. The treatment provided mild relief.     Past Medical History  Diagnosis Date  . Hypertension   . GERD (gastroesophageal reflux disease)   . CHF (congestive heart failure)   . CAD (coronary artery disease)   . Cellulitis 03/2015    Past Surgical History  Procedure Laterality Date  . Rib fracture surgery    . Coronary artery bypass graft      Family History  Problem Relation Age of Onset  . Hypertension Mother   . CAD Father    Social History  Substance Use Topics  . Smoking status: Former Games developer  . Smokeless tobacco: Never Used  . Alcohol Use: No    No Known Allergies  Prior to Admission medications   Medication Sig Start Date End Date Taking? Authorizing Provider  aspirin EC 325 MG tablet Take 325 mg by mouth at  bedtime.   Yes Historical Provider, MD  atenolol (TENORMIN) 100 MG tablet Take 50 mg by mouth at bedtime.   Yes Historical Provider, MD  cephALEXin (KEFLEX) 500 MG capsule Take 1 capsule (500 mg total) by mouth every 12 (twelve) hours. 04/17/15  Yes Alford Highland, MD  cetirizine (ZYRTEC) 10 MG tablet Take 10 mg by mouth at bedtime.   Yes Historical Provider, MD  furosemide (LASIX) 20 MG tablet Take 1 tablet (20 mg total) by mouth daily. 04/17/15  Yes Alford Highland, MD  omeprazole (PRILOSEC) 20 MG capsule Take 20 mg by mouth at bedtime.   Yes Historical Provider, MD  potassium chloride (K-DUR,KLOR-CON) 10 MEQ tablet Take 10 mEq by mouth at bedtime.   Yes Historical Provider, MD  quinapril (ACCUPRIL) 20 MG tablet Take 20 mg by mouth at bedtime.   Yes Historical Provider, MD  simvastatin (ZOCOR) 40 MG tablet Take 40 mg by mouth at bedtime.    Historical Provider, MD     Review of Systems  Constitutional: Positive for fatigue. Negative for appetite change.  HENT: Positive for hearing loss. Negative for congestion, rhinorrhea and sore throat.   Eyes: Positive for visual disturbance (use magnifying glass).  Respiratory: Positive for shortness of breath. Negative for cough, chest tightness and wheezing.   Cardiovascular: Positive for palpitations and leg swelling. Negative for chest pain.  Gastrointestinal: Negative for abdominal pain and abdominal distention.  Endocrine: Negative.   Genitourinary: Negative.   Musculoskeletal: Positive for back pain. Negative for neck pain.  Skin: Positive for color change (fingers/hands  purplish in color). Negative for rash.  Allergic/Immunologic: Negative.   Neurological: Negative for dizziness, light-headedness, numbness and headaches.  Hematological: Negative for adenopathy. Bruises/bleeds easily.  Psychiatric/Behavioral: Negative for sleep disturbance (sleep in recliner due to kyphosis) and dysphoric mood. The patient is not nervous/anxious.         Objective:   Physical Exam  Constitutional: She is oriented to person, place, and time. She appears well-developed and well-nourished.  HENT:  Head: Normocephalic and atraumatic.  Eyes: Conjunctivae are normal. Pupils are equal, round, and reactive to light.  Neck: Normal range of motion. Neck supple.  Cardiovascular: Normal rate and regular rhythm.   Murmur heard. Pulmonary/Chest: Effort normal. She has no wheezes. She has no rales.  Abdominal: Soft. She exhibits no distension. There is no tenderness.  Musculoskeletal: She exhibits edema. She exhibits no tenderness.  Neurological: She is alert and oriented to person, place, and time.  Skin: Skin is warm and dry. There is erythema (left calf slightly warm to touch).  Psychiatric: She has a normal mood and affect. Thought content normal.  Nursing note and vitals reviewed.  BP 130/70 mmHg  Pulse 72  Resp 20  Ht 5' (1.524 m)  Wt 113 lb (51.256 kg)  BMI 22.07 kg/m2  SpO2 100%        Assessment & Plan:  1: Chronic heart failure with reduced ejection fraction- Patient presents with some fatigue and shortness of breath with little exertion. She says that she gets tired getting dressed and has to rest afterwards. She continues to do her own cooking with rest periods. She was weighing herself but says that her scale isn't working correctly so a set of scales was given to her today. Instructed to call for an overnight weight gain of >2 pounds or a weekly weight gain of >5 pounds. She does have bilateral edema in her lower legs with the R>L. She says that she only takes her fluid pill when the swelling "gets bad" because it makes her go to the bathroom. Discussed the importance of taking the diuretic and the purpose of it and she agreed to take it on M, W, F and see if that works. She doesn't elevate her legs much and she was encouraged to elevate them when sitting at home. She does sleep in a recliner but says that it's mostly because of her  kyphosis. She does not add salt to her food and mostly cooks fresh foods or frozen vegetables. Written information regarding a 2000mg  sodium diet was given to her. 2: Cellulitis- Left calf is still slightly red although the friend that is with her says it looks much better. She is continuing to take the antibiotic. Discussed elevating the leg to help with this as well. 3: Circulation- Both of patient's hands are cool to the touch and she says that they've been like this for quite some time. Good strong radial pulses bilaterally. Encouraged her to followup with her PCP regarding this.   Return in 1 month or sooner for any questions/problems before then.

## 2015-04-25 NOTE — Patient Instructions (Signed)
Continue weighing daily and call for an overnight weight gain of > 2 pounds or a weekly weight gain of >5 pounds.  Take fluid pill on Monday, Wednesday and Friday.

## 2015-05-26 ENCOUNTER — Encounter: Payer: Self-pay | Admitting: Family

## 2015-05-26 ENCOUNTER — Ambulatory Visit: Payer: Medicare Other | Attending: Family | Admitting: Family

## 2015-05-26 VITALS — BP 121/50 | HR 76 | Resp 20 | Ht 60.0 in | Wt 113.0 lb

## 2015-05-26 DIAGNOSIS — Z7982 Long term (current) use of aspirin: Secondary | ICD-10-CM | POA: Insufficient documentation

## 2015-05-26 DIAGNOSIS — Z79899 Other long term (current) drug therapy: Secondary | ICD-10-CM | POA: Diagnosis not present

## 2015-05-26 DIAGNOSIS — K219 Gastro-esophageal reflux disease without esophagitis: Secondary | ICD-10-CM | POA: Insufficient documentation

## 2015-05-26 DIAGNOSIS — Z87891 Personal history of nicotine dependence: Secondary | ICD-10-CM | POA: Diagnosis not present

## 2015-05-26 DIAGNOSIS — I2510 Atherosclerotic heart disease of native coronary artery without angina pectoris: Secondary | ICD-10-CM | POA: Diagnosis not present

## 2015-05-26 DIAGNOSIS — E785 Hyperlipidemia, unspecified: Secondary | ICD-10-CM | POA: Diagnosis not present

## 2015-05-26 DIAGNOSIS — I10 Essential (primary) hypertension: Secondary | ICD-10-CM | POA: Insufficient documentation

## 2015-05-26 DIAGNOSIS — I999 Unspecified disorder of circulatory system: Secondary | ICD-10-CM

## 2015-05-26 DIAGNOSIS — I5022 Chronic systolic (congestive) heart failure: Secondary | ICD-10-CM

## 2015-05-26 DIAGNOSIS — R0602 Shortness of breath: Secondary | ICD-10-CM | POA: Insufficient documentation

## 2015-05-26 NOTE — Patient Instructions (Signed)
Continue weighing daily and call for an overnight weight gain of > 2 pounds or a weekly weight gain of >5 pounds.  Use Mrs. Dash instead of salt on the food.

## 2015-05-26 NOTE — Progress Notes (Signed)
Subjective:    Patient ID: [name] [name], female    DOB: 25-Nov-1919, 81 y.o.   MRN: [id number]  Congestive Heart Failure Presents for follow-up visit. The disease course has been improving. Associated symptoms include edema, fatigue and shortness of breath. Pertinent negatives include no abdominal pain, chest pain, chest pressure, orthopnea or palpitations. The symptoms have been improving. Past treatments include ACE inhibitors, beta blockers and salt and fluid restriction. The treatment provided moderate relief. Compliance with prior treatments has been good. Her past medical history is significant for CAD and HTN. She has one 1st degree relative with heart disease. Compliance with total regimen is 76-100%.  Other This is a chronic (edema) problem. The current episode started more than 1 year ago. The problem occurs daily. The problem has been rapidly improving. Associated symptoms include congestion, coughing and fatigue. Pertinent negatives include no abdominal pain, chest pain, headaches, myalgias, neck pain, numbness or sore throat. The symptoms are aggravated by standing and walking. She has tried position changes for the symptoms. The treatment provided moderate relief.    Past Medical History  Diagnosis Date  . Hypertension   . GERD (gastroesophageal reflux disease)   . CHF (congestive heart failure) (HCC)   . CAD (coronary artery disease)   . Cellulitis 03/2015    Past Surgical History  Procedure Laterality Date  . Rib fracture surgery    . Coronary artery bypass graft      Family History  Problem Relation Age of Onset  . Hypertension Mother   . CAD Father     Social History  Substance Use Topics  . Smoking status: Former Games developer  . Smokeless tobacco: Never Used  . Alcohol Use: No    No Known Allergies  Prior to Admission medications   Medication Sig Start Date End Date Taking? Authorizing Provider  aspirin EC 325 MG tablet Take 325 mg by mouth at bedtime.   Yes  Historical Provider, MD  atenolol (TENORMIN) 100 MG tablet Take 50 mg by mouth at bedtime.   Yes Historical Provider, MD  cetirizine (ZYRTEC) 10 MG tablet Take 10 mg by mouth at bedtime.   Yes Historical Provider, MD  furosemide (LASIX) 20 MG tablet Take 1 tablet (20 mg total) by mouth daily. 04/17/15  Yes Alford Highland, MD  omeprazole (PRILOSEC) 20 MG capsule Take 20 mg by mouth at bedtime.   Yes Historical Provider, MD  potassium chloride (K-DUR,KLOR-CON) 10 MEQ tablet Take 10 mEq by mouth at bedtime.   Yes Historical Provider, MD  quinapril (ACCUPRIL) 20 MG tablet Take 20 mg by mouth at bedtime.   Yes Historical Provider, MD  simvastatin (ZOCOR) 40 MG tablet Take 40 mg by mouth at bedtime.   Yes Historical Provider, MD      Review of Systems  Constitutional: Positive for fatigue. Negative for appetite change.  HENT: Positive for congestion. Negative for postnasal drip and sore throat.   Eyes: Negative.   Respiratory: Positive for cough and shortness of breath. Negative for chest tightness.   Cardiovascular: Positive for leg swelling. Negative for chest pain and palpitations.  Gastrointestinal: Negative for abdominal pain and abdominal distention.  Endocrine: Negative.   Genitourinary: Positive for frequency (due to fluid pill). Negative for pelvic pain.  Musculoskeletal: Positive for back pain. Negative for myalgias and neck pain.  Skin: Negative.   Allergic/Immunologic: Negative.   Neurological: Positive for dizziness. Negative for light-headedness, numbness and headaches.  Hematological: Negative for adenopathy. Bruises/bleeds easily.  Psychiatric/Behavioral: Negative for  sleep disturbance (sleeps in recliner due to kyphosis) and dysphoric mood. The patient is not nervous/anxious.        Objective:   Physical Exam  Constitutional: She is oriented to person, place, and time. She appears well-developed and well-nourished.  HENT:  Head: Normocephalic and atraumatic.  Eyes:  Conjunctivae are normal. Pupils are equal, round, and reactive to light.  Neck: Normal range of motion. Neck supple.  Cardiovascular: Normal rate and regular rhythm.   Pulmonary/Chest: Effort normal. She has no wheezes. She has no rales.  Abdominal: Soft. There is no tenderness. There is no rebound.  Musculoskeletal: She exhibits edema (1+ edema in bilateral lower legs with some clear weeping of fluids). She exhibits no tenderness.  Neurological: She is alert and oriented to person, place, and time.  Skin: Skin is warm. There is erythema (slight on both lower legs but much improved).  Psychiatric: She has a normal mood and affect. Her behavior is normal.  Nursing note and vitals reviewed.  BP 121/50 mmHg  Pulse 76  Resp 20  Ht 5' (1.524 m)  Wt 113 lb (51.256 kg)  BMI 22.07 kg/m2  SpO2         Assessment & Plan:  1: Chronic heart failure with reduced ejection fraction- Patient presents with continued fatigue and shortness of breath upon exertion. She feels like she is more tired but says that the physical therapist came to her home prior to her coming to this visit and she's wore out from that. She continues to have some edema in her lower legs but it's much improved from the last time she was here. She is taking her diuretic on a daily basis now even though she doesn't like to. PCP strongly encouraged her to take it daily. She is trying to elevate her legs some when she's at home. She continues to weigh daily and reports a stable weight. Reminded her to call for an overnight weight gain of >2 pounds or a weekly weight gain of >5 pounds. She does most of her own cooking and uses salt "on occasion". Encouraged her to switch to Mrs. Dash for seasoning instead of using salt.  2: Circulation- Both hands continue to be purplish in color although they are both warm with good pulses. Encouraged her to follow up with her PCP should this worsen.  3: Hyperlipidemia- She continues to take her  simvastatin daily. 4: HTN- Blood pressure looks good today. Continue medications.  Return in 3 months or sooner for any questions/problems before the next office visit.

## 2015-05-27 DIAGNOSIS — I10 Essential (primary) hypertension: Secondary | ICD-10-CM | POA: Insufficient documentation

## 2015-05-27 DIAGNOSIS — E785 Hyperlipidemia, unspecified: Secondary | ICD-10-CM | POA: Insufficient documentation

## 2015-08-10 ENCOUNTER — Emergency Department
Admission: EM | Admit: 2015-08-10 | Discharge: 2015-08-10 | Disposition: A | Discharge status: Home / Self Care | Payer: Medicare Other | Attending: Emergency Medicine | Admitting: Emergency Medicine

## 2015-08-10 ENCOUNTER — Emergency Department: Payer: Medicare Other

## 2015-08-10 DIAGNOSIS — Z79899 Other long term (current) drug therapy: Secondary | ICD-10-CM | POA: Diagnosis not present

## 2015-08-10 DIAGNOSIS — R062 Wheezing: Secondary | ICD-10-CM | POA: Diagnosis not present

## 2015-08-10 DIAGNOSIS — Z87891 Personal history of nicotine dependence: Secondary | ICD-10-CM | POA: Insufficient documentation

## 2015-08-10 DIAGNOSIS — Z7982 Long term (current) use of aspirin: Secondary | ICD-10-CM | POA: Diagnosis not present

## 2015-08-10 DIAGNOSIS — I10 Essential (primary) hypertension: Secondary | ICD-10-CM | POA: Insufficient documentation

## 2015-08-10 DIAGNOSIS — R600 Localized edema: Secondary | ICD-10-CM | POA: Insufficient documentation

## 2015-08-10 DIAGNOSIS — R0600 Dyspnea, unspecified: Secondary | ICD-10-CM

## 2015-08-10 LAB — CBC WITH DIFFERENTIAL/PLATELET
Basophils Absolute: 0 10*3/uL (ref 0–0.1)
Basophils Relative: 1 %
Eosinophils Absolute: 0.1 10*3/uL (ref 0–0.7)
Eosinophils Relative: 2 %
HEMATOCRIT: 31 % — AB (ref 35.0–47.0)
HEMOGLOBIN: 9.6 g/dL — AB (ref 12.0–16.0)
LYMPHS ABS: 0.7 10*3/uL — AB (ref 1.0–3.6)
LYMPHS PCT: 12 %
MCH: 23.9 pg — AB (ref 26.0–34.0)
MCHC: 30.8 g/dL — ABNORMAL LOW (ref 32.0–36.0)
MCV: 77.7 fL — AB (ref 80.0–100.0)
Monocytes Absolute: 0.4 10*3/uL (ref 0.2–0.9)
Monocytes Relative: 7 %
NEUTROS PCT: 78 %
Neutro Abs: 5 10*3/uL (ref 1.4–6.5)
Platelets: 336 10*3/uL (ref 150–440)
RBC: 3.99 MIL/uL (ref 3.80–5.20)
RDW: 17.9 % — ABNORMAL HIGH (ref 11.5–14.5)
WBC: 6.4 10*3/uL (ref 3.6–11.0)

## 2015-08-10 LAB — COMPREHENSIVE METABOLIC PANEL
ALK PHOS: 115 U/L (ref 38–126)
ALT: 25 U/L (ref 14–54)
ANION GAP: 6 (ref 5–15)
AST: 29 U/L (ref 15–41)
Albumin: 3.6 g/dL (ref 3.5–5.0)
BILIRUBIN TOTAL: 0.3 mg/dL (ref 0.3–1.2)
BUN: 34 mg/dL — ABNORMAL HIGH (ref 6–20)
CALCIUM: 9.3 mg/dL (ref 8.9–10.3)
CO2: 29 mmol/L (ref 22–32)
Chloride: 109 mmol/L (ref 101–111)
Creatinine, Ser: 0.89 mg/dL (ref 0.44–1.00)
GFR calc non Af Amer: 54 mL/min — ABNORMAL LOW (ref >60)
Glucose, Bld: 93 mg/dL (ref 65–99)
Potassium: 4.9 mmol/L (ref 3.5–5.1)
SODIUM: 144 mmol/L (ref 135–145)
TOTAL PROTEIN: 7.3 g/dL (ref 6.5–8.1)

## 2015-08-10 LAB — TROPONIN I
Troponin I: 0.04 ng/mL — ABNORMAL HIGH (ref <0.031)
Troponin I: 0.04 ng/mL — ABNORMAL HIGH (ref <0.031)

## 2015-08-10 NOTE — Discharge Instructions (Signed)
You have been seen in the emergency department today for shortness of breath, which has resolved. Her workup including labs and chest x-ray do not show any acute abnormalities. Please follow-up with your primary care physician tomorrow for recheck/reevaluation. Return to the emergency department if you have any chest pain, trouble breathing, or any other symptom personally concerning to your self.   Shortness of Breath Shortness of breath means you have trouble breathing. Shortness of breath needs medical care right away. HOME CARE   Do not smoke.  Avoid being around chemicals or things (paint fumes, dust) that may bother your breathing.  Rest as needed. Slowly begin your normal activities.  Only take medicines as told by your doctor.  Keep all doctor visits as told. GET HELP RIGHT AWAY IF:   Your shortness of breath gets worse.  You feel lightheaded, pass out (faint), or have a cough that is not helped by medicine.  You cough up blood.  You have pain with breathing.  You have pain in your chest, arms, shoulders, or belly (abdomen).  You have a fever.  You cannot walk up stairs or exercise the way you normally do.  You do not get better in the time expected.  You have a hard time doing normal activities even with rest.  You have problems with your medicines.  You have any new symptoms. MAKE SURE YOU:  Understand these instructions.  Will watch your condition.  Will get help right away if you are not doing well or get worse.   This information is not intended to replace advice given to you by your health care provider. Make sure you discuss any questions you have with your health care provider.   Document Released: 01/26/2008 Document Revised: 08/14/2013 Document Reviewed: 10/25/2011 Elsevier Interactive Patient Education [employer]2016 [employer]

## 2015-08-10 NOTE — ED Provider Notes (Addendum)
[facility] Emergency Department Provider Note  Time seen: 7:46 PM  I have reviewed the triage vital signs and the nursing notes.   HISTORY  Chief Complaint Respiratory Distress    HPI [name] [name] is a 81 y.o. female with a past medical history of hypertension, gastric reflux, congestive heart failure, who presents the emergency department with difficulty breathing. According to EMS report and the patient she was having significant difficulty breathing today. EMS states initial room air O2 saturation of 76%. The same route to the hospital they placed the patient on a nonrebreather mask, and she improved dramatically. The take the patient off the nonrebreather mask on room air and she was satting 95%. Patient denies any chest pain now or at any time. Denies feeling short of breath currently states she feels much better than she did at home. Denies recent cough or congestion. Denies fever. Patient doesn't mild lower extremity swelling which she states is fairly normal for her. Patient takes furosemide daily.     Past Medical History  Diagnosis Date  . Hypertension   . GERD (gastroesophageal reflux disease)   . CHF (congestive heart failure) (HCC)   . CAD (coronary artery disease)   . Cellulitis 03/2015    Patient Active Problem List   Diagnosis Date Noted  . Hyperlipidemia 05/27/2015  . HTN (hypertension) 05/27/2015  . Chronic systolic heart failure (HCC) 04/25/2015  . Circulation problem 04/25/2015  . Cellulitis 04/14/2015    Past Surgical History  Procedure Laterality Date  . Rib fracture surgery    . Coronary artery bypass graft      Current Outpatient Rx  Name  Route  Sig  Dispense  Refill  . aspirin EC 325 MG tablet   Oral   Take 325 mg by mouth at bedtime.         Marland Kitchen. atenolol (TENORMIN) 100 MG tablet   Oral   Take 50 mg by mouth at bedtime.         . cetirizine (ZYRTEC) 10 MG tablet   Oral   Take 10 mg by mouth at bedtime.        . furosemide (LASIX) 20 MG tablet   Oral   Take 1 tablet (20 mg total) by mouth daily.   30 tablet   0   . omeprazole (PRILOSEC) 20 MG capsule   Oral   Take 20 mg by mouth at bedtime.         . potassium chloride (K-DUR,KLOR-CON) 10 MEQ tablet   Oral   Take 10 mEq by mouth at bedtime.         . quinapril (ACCUPRIL) 20 MG tablet   Oral   Take 20 mg by mouth at bedtime.         . simvastatin (ZOCOR) 40 MG tablet   Oral   Take 40 mg by mouth at bedtime.           Allergies Review of patient's allergies indicates no known allergies.  Family History  Problem Relation Age of Onset  . Hypertension Mother   . CAD Father     Social History Social History  Substance Use Topics  . Smoking status: Former Games developermoker  . Smokeless tobacco: Never Used  . Alcohol Use: No    Review of Systems Constitutional: Negative for fever. Cardiovascular: Negative for chest pain. Respiratory: Positive for shortness of breath now resolved. Gastrointestinal: Negative for abdominal pain Musculoskeletal: Negative for back pain. Neurological: Negative for headache 10-point  ROS otherwise negative.  ____________________________________________   PHYSICAL EXAM:  VITAL SIGNS: ED Triage Vitals  Enc Vitals Group     BP --      Pulse Rate 08/10/15 1848 74     Resp 08/10/15 1848 22     Temp 08/10/15 1848 98.1 F (36.7 C)     Temp Source 08/10/15 1848 Oral     SpO2 08/10/15 1848 94 %     Weight 08/10/15 1848 118 lb (53.524 kg)     Height --      Head Cir --      Peak Flow --      Pain Score --      Pain Loc --      Pain Edu? --      Excl. in GC? --     Constitutional: Alert. Well appearing and in no distress. Eyes: Normal exam ENT   Head: Normocephalic and atraumatic   Mouth/Throat: Mucous membranes are moist. Cardiovascular: Normal rate, regular rhythm. No murmur Respiratory: Normal respiratory effort without tachypnea nor retractions. Slight expiratory wheeze  auscultated on the left. No rales or rhonchi Gastrointestinal: Soft and nontender. No distention.  Musculoskeletal: Nontender with normal range of motion in all extremities. Mild lower extremity edema, equal bilaterally Neurologic:  Normal speech and language. No gross focal neurologic deficits Skin:  Skin is warm, dry and intact.  Psychiatric: Mood and affect are normal. Speech and behavior are normal.   ____________________________________________    EKG  EKG reviewed and interpreted by myself shows sinus rhythm at 70 bpm, slightly widened QRS, normal axis, EKG most consistent with left bundle branch block.  ____________________________________________    RADIOLOGY  Chest x-ray is similar to prior study and they state mild bilateral lung opacities mild congestive heart failure with small effusion. Largely unchanged.   INITIAL IMPRESSION / ASSESSMENT AND PLAN / ED COURSE  Pertinent labs & imaging results that were available during my care of the patient were reviewed by me and considered in my medical decision making (see chart for details).   Currently the patient appears very well, 95-98 percent on room air during my examination. Chest x-ray shows no acute abnormalities. Currently awaiting lab results, we'll continue to monitor the patient on telemetry in the emergency department while awaiting family arrival, and labs.  X-rays largely unchanged, labs are largely at baseline with a slight troponin elevation 0.04. I discussed with the patient as well as a family friend admission for observation versus repeat troponin. The family friend states the patient would strongly wished to go home as she still cares for her older children. Patient denies any trouble breathing since arriving to the hospital. States she feels well and we will repeat a troponin, as long as it is within normal limits we'll discharge the patient home with primary care follow-up tomorrow. The patient and family friend  are agreeable. Family friend states the patient has home healthcare several times a week, the son also has home health care with him she lives, and the daughter is able to help out at home.  Troponin unchanged. We'll discharge the patient home with primary care follow-up.  ____________________________________________   FINAL CLINICAL IMPRESSION(S) / ED DIAGNOSES  Dyspnea   Minna Antis, MD 08/10/15 2112  Minna Antis, MD 08/10/15 2200

## 2015-08-10 NOTE — ED Notes (Signed)
Pt arrived via EMS with C/O difficulty breathing, was 76% on room air on EMS arrival, currently 95% on room air. Hx of CHF

## 2015-08-20 ENCOUNTER — Emergency Department: Payer: Medicare Other

## 2015-08-20 ENCOUNTER — Inpatient Hospital Stay
Admission: EM | Admit: 2015-08-20 | Discharge: 2015-08-23 | DRG: 643 | Disposition: A | Discharge status: Skilled Nursing Facility | Payer: Medicare Other | Attending: Internal Medicine | Admitting: Internal Medicine

## 2015-08-20 ENCOUNTER — Encounter: Payer: Self-pay | Admitting: Emergency Medicine

## 2015-08-20 ENCOUNTER — Inpatient Hospital Stay: Payer: Medicare Other

## 2015-08-20 DIAGNOSIS — N3001 Acute cystitis with hematuria: Secondary | ICD-10-CM | POA: Diagnosis present

## 2015-08-20 DIAGNOSIS — K219 Gastro-esophageal reflux disease without esophagitis: Secondary | ICD-10-CM | POA: Diagnosis present

## 2015-08-20 DIAGNOSIS — E43 Unspecified severe protein-calorie malnutrition: Secondary | ICD-10-CM

## 2015-08-20 DIAGNOSIS — I429 Cardiomyopathy, unspecified: Secondary | ICD-10-CM | POA: Diagnosis not present

## 2015-08-20 DIAGNOSIS — I248 Other forms of acute ischemic heart disease: Secondary | ICD-10-CM | POA: Diagnosis present

## 2015-08-20 DIAGNOSIS — I10 Essential (primary) hypertension: Secondary | ICD-10-CM | POA: Diagnosis not present

## 2015-08-20 DIAGNOSIS — R001 Bradycardia, unspecified: Secondary | ICD-10-CM | POA: Diagnosis not present

## 2015-08-20 DIAGNOSIS — I071 Rheumatic tricuspid insufficiency: Secondary | ICD-10-CM | POA: Diagnosis not present

## 2015-08-20 DIAGNOSIS — R680 Hypothermia, not associated with low environmental temperature: Secondary | ICD-10-CM | POA: Diagnosis present

## 2015-08-20 DIAGNOSIS — S0181XA Laceration without foreign body of other part of head, initial encounter: Secondary | ICD-10-CM | POA: Diagnosis present

## 2015-08-20 DIAGNOSIS — Z7982 Long term (current) use of aspirin: Secondary | ICD-10-CM

## 2015-08-20 DIAGNOSIS — IMO0002 Reserved for concepts with insufficient information to code with codable children: Secondary | ICD-10-CM

## 2015-08-20 DIAGNOSIS — R531 Weakness: Secondary | ICD-10-CM

## 2015-08-20 DIAGNOSIS — I959 Hypotension, unspecified: Secondary | ICD-10-CM | POA: Diagnosis present

## 2015-08-20 DIAGNOSIS — E785 Hyperlipidemia, unspecified: Secondary | ICD-10-CM | POA: Diagnosis present

## 2015-08-20 DIAGNOSIS — Z6820 Body mass index (BMI) 20.0-20.9, adult: Secondary | ICD-10-CM | POA: Diagnosis not present

## 2015-08-20 DIAGNOSIS — W19XXXA Unspecified fall, initial encounter: Secondary | ICD-10-CM | POA: Diagnosis present

## 2015-08-20 DIAGNOSIS — Z23 Encounter for immunization: Secondary | ICD-10-CM

## 2015-08-20 DIAGNOSIS — I495 Sick sinus syndrome: Secondary | ICD-10-CM

## 2015-08-20 DIAGNOSIS — I447 Left bundle-branch block, unspecified: Secondary | ICD-10-CM | POA: Diagnosis not present

## 2015-08-20 DIAGNOSIS — I5022 Chronic systolic (congestive) heart failure: Secondary | ICD-10-CM | POA: Diagnosis present

## 2015-08-20 DIAGNOSIS — E039 Hypothyroidism, unspecified: Secondary | ICD-10-CM | POA: Diagnosis present

## 2015-08-20 DIAGNOSIS — R9430 Abnormal result of cardiovascular function study, unspecified: Secondary | ICD-10-CM

## 2015-08-20 DIAGNOSIS — T68XXXA Hypothermia, initial encounter: Secondary | ICD-10-CM

## 2015-08-20 DIAGNOSIS — I2510 Atherosclerotic heart disease of native coronary artery without angina pectoris: Secondary | ICD-10-CM | POA: Diagnosis present

## 2015-08-20 DIAGNOSIS — R778 Other specified abnormalities of plasma proteins: Secondary | ICD-10-CM

## 2015-08-20 DIAGNOSIS — Z87891 Personal history of nicotine dependence: Secondary | ICD-10-CM | POA: Diagnosis not present

## 2015-08-20 DIAGNOSIS — R0600 Dyspnea, unspecified: Secondary | ICD-10-CM

## 2015-08-20 DIAGNOSIS — R7989 Other specified abnormal findings of blood chemistry: Secondary | ICD-10-CM

## 2015-08-20 LAB — URINALYSIS COMPLETE WITH MICROSCOPIC (ARMC ONLY)
BILIRUBIN URINE: NEGATIVE
Glucose, UA: NEGATIVE mg/dL
KETONES UR: NEGATIVE mg/dL
NITRITE: NEGATIVE
PH: 5 (ref 5.0–8.0)
PROTEIN: 30 mg/dL — AB
Specific Gravity, Urine: 1.027 (ref 1.005–1.030)

## 2015-08-20 LAB — COMPREHENSIVE METABOLIC PANEL
ALBUMIN: 3.3 g/dL — AB (ref 3.5–5.0)
ALT: 18 U/L (ref 14–54)
AST: 21 U/L (ref 15–41)
Alkaline Phosphatase: 91 U/L (ref 38–126)
Anion gap: 5 (ref 5–15)
BILIRUBIN TOTAL: 0.5 mg/dL (ref 0.3–1.2)
BUN: 35 mg/dL — AB (ref 6–20)
CO2: 28 mmol/L (ref 22–32)
CREATININE: 0.9 mg/dL (ref 0.44–1.00)
Calcium: 9.1 mg/dL (ref 8.9–10.3)
Chloride: 107 mmol/L (ref 101–111)
GFR calc Af Amer: >60 mL/min (ref >60)
GFR, EST NON AFRICAN AMERICAN: 53 mL/min — AB (ref >60)
GLUCOSE: 113 mg/dL — AB (ref 65–99)
Potassium: 3.9 mmol/L (ref 3.5–5.1)
Sodium: 140 mmol/L (ref 135–145)
TOTAL PROTEIN: 7.1 g/dL (ref 6.5–8.1)

## 2015-08-20 LAB — GLUCOSE, CAPILLARY: Glucose-Capillary: 91 mg/dL (ref 65–99)

## 2015-08-20 LAB — CBC WITH DIFFERENTIAL/PLATELET
BASOS ABS: 0 10*3/uL (ref 0–0.1)
BASOS PCT: 0 %
Eosinophils Absolute: 0.1 10*3/uL (ref 0–0.7)
Eosinophils Relative: 1 %
HEMATOCRIT: 33.5 % — AB (ref 35.0–47.0)
HEMOGLOBIN: 10.2 g/dL — AB (ref 12.0–16.0)
LYMPHS PCT: 6 %
Lymphs Abs: 0.5 10*3/uL — ABNORMAL LOW (ref 1.0–3.6)
MCH: 22.7 pg — ABNORMAL LOW (ref 26.0–34.0)
MCHC: 30.4 g/dL — ABNORMAL LOW (ref 32.0–36.0)
MCV: 74.6 fL — AB (ref 80.0–100.0)
Monocytes Absolute: 0.5 10*3/uL (ref 0.2–0.9)
Monocytes Relative: 6 %
NEUTROS ABS: 8.1 10*3/uL — AB (ref 1.4–6.5)
NEUTROS PCT: 87 %
Platelets: 270 10*3/uL (ref 150–440)
RBC: 4.49 MIL/uL (ref 3.80–5.20)
RDW: 18.3 % — ABNORMAL HIGH (ref 11.5–14.5)
WBC: 9.2 10*3/uL (ref 3.6–11.0)

## 2015-08-20 LAB — MRSA PCR SCREENING: MRSA by PCR: POSITIVE — AB

## 2015-08-20 LAB — LACTIC ACID, PLASMA: Lactic Acid, Venous: 1.6 mmol/L (ref 0.5–2.0)

## 2015-08-20 LAB — CK: CK TOTAL: 42 U/L (ref 38–234)

## 2015-08-20 LAB — TROPONIN I
TROPONIN I: 0.07 ng/mL — AB (ref <0.031)
Troponin I: 0.07 ng/mL — ABNORMAL HIGH (ref <0.031)

## 2015-08-20 MED ORDER — VANCOMYCIN HCL IN DEXTROSE 1-5 GM/200ML-% IV SOLN
1000.0000 mg | Freq: Once | INTRAVENOUS | Status: AC
  Administered 2015-08-20: 1000 mg via INTRAVENOUS
  Filled 2015-08-20: qty 200

## 2015-08-20 MED ORDER — ATROPINE SULFATE 0.1 MG/ML IJ SOLN
INTRAMUSCULAR | Status: AC
  Administered 2015-08-20: 0.5 mg
  Filled 2015-08-20: qty 10

## 2015-08-20 MED ORDER — LORATADINE 10 MG PO TABS
10.0000 mg | ORAL_TABLET | Freq: Every day | ORAL | Status: DC
  Administered 2015-08-21 – 2015-08-23 (×3): 10 mg via ORAL
  Filled 2015-08-20 (×3): qty 1

## 2015-08-20 MED ORDER — ACETAMINOPHEN 325 MG PO TABS
650.0000 mg | ORAL_TABLET | Freq: Four times a day (QID) | ORAL | Status: DC | PRN
  Administered 2015-08-21 – 2015-08-22 (×2): 650 mg via ORAL
  Filled 2015-08-20 (×2): qty 2

## 2015-08-20 MED ORDER — SODIUM CHLORIDE 0.9 % IV BOLUS (SEPSIS)
1000.0000 mL | Freq: Once | INTRAVENOUS | Status: AC
  Administered 2015-08-20: 1000 mL via INTRAVENOUS

## 2015-08-20 MED ORDER — ACETAMINOPHEN 650 MG RE SUPP: 650.0000 mg | Freq: Four times a day (QID) | RECTAL | Status: DC | PRN

## 2015-08-20 MED ORDER — SODIUM CHLORIDE 0.9 % IJ SOLN
3.0000 mL | Freq: Two times a day (BID) | INTRAMUSCULAR | Status: DC
Start: 2015-08-20 — End: 2015-08-23
  Administered 2015-08-20 – 2015-08-23 (×6): 3 mL via INTRAVENOUS

## 2015-08-20 MED ORDER — PIPERACILLIN-TAZOBACTAM 3.375 G IVPB
3.3750 g | Freq: Once | INTRAVENOUS | Status: AC
  Administered 2015-08-20: 3.375 g via INTRAVENOUS
  Filled 2015-08-20: qty 50

## 2015-08-20 MED ORDER — PANTOPRAZOLE SODIUM 40 MG PO TBEC
40.0000 mg | DELAYED_RELEASE_TABLET | Freq: Every day | ORAL | Status: DC
  Administered 2015-08-20 – 2015-08-23 (×3): 40 mg via ORAL
  Filled 2015-08-20 (×3): qty 1

## 2015-08-20 MED ORDER — DEXTROSE 5 % IV SOLN
INTRAVENOUS | Status: AC
  Filled 2015-08-20: qty 10

## 2015-08-20 MED ORDER — ENOXAPARIN SODIUM 30 MG/0.3ML ~~LOC~~ SOLN
30.0000 mg | SUBCUTANEOUS | Status: DC
  Administered 2015-08-20: 30 mg via SUBCUTANEOUS
  Filled 2015-08-20 (×2): qty 0.3

## 2015-08-20 MED ORDER — ATROPINE SULFATE 1 MG/ML IJ SOLN
0.5000 mg | Freq: Once | INTRAMUSCULAR | Status: AC
  Filled 2015-08-20: qty 0.5

## 2015-08-20 MED ORDER — ATROPINE SULFATE 0.1 MG/ML IJ SOLN
0.5000 mg | INTRAMUSCULAR | Status: DC | PRN
  Filled 2015-08-20: qty 5

## 2015-08-20 MED ORDER — SIMVASTATIN 40 MG PO TABS
40.0000 mg | ORAL_TABLET | Freq: Every day | ORAL | Status: DC
  Administered 2015-08-21 – 2015-08-22 (×2): 40 mg via ORAL
  Filled 2015-08-20 (×2): qty 1

## 2015-08-20 MED ORDER — DEXTROSE 5 % IV SOLN
1.0000 g | INTRAVENOUS | Status: DC
  Administered 2015-08-21: 1 g via INTRAVENOUS
  Filled 2015-08-20 (×2): qty 10

## 2015-08-20 MED ORDER — ASPIRIN EC 325 MG PO TBEC
325.0000 mg | DELAYED_RELEASE_TABLET | Freq: Every day | ORAL | Status: DC
  Administered 2015-08-21 – 2015-08-23 (×3): 325 mg via ORAL
  Filled 2015-08-20 (×3): qty 1

## 2015-08-20 NOTE — ED Provider Notes (Signed)
[facility] Emergency Department Provider Note   ____________________________________________  Time seen: 1430   I have reviewed the triage vital signs and the nursing notes.   HISTORY  Chief Complaint Fall   History limited by: patient poor historian   HPI Belita I Maenza is a 81 y.o. female who presents to the emergency department after a fall. Per EMS patient was found down by the daughter. The daughter was not present to give any history. Patient herself cannot give a good history of what happened. She is unclear why she fell. She denies any pain however. She denied any chest pain or shortness of breath. She did suffer a small laceration at left forehead which was wrapped by EMS.   Past Medical History  Diagnosis Date  . Hypertension   . GERD (gastroesophageal reflux disease)   . CHF (congestive heart failure) (HCC)   . CAD (coronary artery disease)   . Cellulitis 03/2015    Patient Active Problem List   Diagnosis Date Noted  . Hyperlipidemia 05/27/2015  . HTN (hypertension) 05/27/2015  . Chronic systolic heart failure (HCC) 04/25/2015  . Circulation problem 04/25/2015  . Cellulitis 04/14/2015    Past Surgical History  Procedure Laterality Date  . Rib fracture surgery    . Coronary artery bypass graft      Current Outpatient Rx  Name  Route  Sig  Dispense  Refill  . aspirin EC 325 MG tablet   Oral   Take 325 mg by mouth at bedtime.         Marland Kitchen atenolol (TENORMIN) 100 MG tablet   Oral   Take 50 mg by mouth at bedtime.         . cetirizine (ZYRTEC) 10 MG tablet   Oral   Take 10 mg by mouth at bedtime.         . furosemide (LASIX) 20 MG tablet   Oral   Take 1 tablet (20 mg total) by mouth daily.   30 tablet   0   . omeprazole (PRILOSEC) 20 MG capsule   Oral   Take 20 mg by mouth at bedtime.         . potassium chloride (K-DUR,KLOR-CON) 10 MEQ tablet   Oral   Take 10 mEq by mouth at bedtime.         . quinapril  (ACCUPRIL) 20 MG tablet   Oral   Take 20 mg by mouth at bedtime.         . simvastatin (ZOCOR) 40 MG tablet   Oral   Take 40 mg by mouth at bedtime.           Allergies Review of patient's allergies indicates no known allergies.  Family History  Problem Relation Age of Onset  . Hypertension Mother   . CAD Father     Social History Social History  Substance Use Topics  . Smoking status: Former Games developer  . Smokeless tobacco: Never Used  . Alcohol Use: No    Review of Systems  Constitutional: Negative for fever. Cardiovascular: Negative for chest pain. Respiratory: Negative for shortness of breath. Gastrointestinal: Negative for abdominal pain, vomiting and diarrhea. Neurological: Negative for headaches, focal weakness or numbness.   10-point ROS otherwise negative.  ____________________________________________   PHYSICAL EXAM:  VITAL SIGNS:    42  19   150/54 mmHg  97 %     Constitutional: Awake and alert, pleasant, not oriented to event Eyes: Conjunctivae are normal. PERRL. Normal extraocular  movements. ENT   Head: Normocephalic. Small 1 cm laceration to the left forehead, hemostatic, superficial.   Nose: No congestion/rhinnorhea.   Mouth/Throat: Mucous membranes are moist.   Neck: No stridor. No midline tenderness.  Hematological/Lymphatic/Immunilogical: No cervical lymphadenopathy. Cardiovascular: bradycardic, regular rhythm.  No murmurs, rubs, or gallops. Respiratory: Normal respiratory effort without tachypnea nor retractions. Breath sounds are clear and equal bilaterally. No wheezes/rales/rhonchi. Gastrointestinal: Soft and nontender. No distention.  Genitourinary: Deferred Musculoskeletal: Normal range of motion in all extremities. No joint effusions.  No lower extremity tenderness nor edema. Neurologic:  Normal speech and language. No gross focal neurologic deficits are appreciated.  Skin:  Skin is warm, dry and intact. No rash  noted.  ____________________________________________    LABS (pertinent positives/negatives)  Trop 0.07 CK 42 Cr 0.90 WBC 9.2 Hgb 10.2 UA: WBC 6-30  ____________________________________________   EKG  I, Phineas Semen, attending physician, personally viewed and interpreted this EKG  EKG Time: 1537 Rate: 40 Rhythm: sinus bradycardia Axis: normal Intervals: qtc 451 QRS: LBBB ST changes: no st elevation Impression: abnormal ekg  ____________________________________________    RADIOLOGY  CT head/c-spine IMPRESSION: Mild soft tissue swelling/hematoma overlying the left frontal bone. No evidence of calvarial fracture.  Atrophy with small vessel ischemic changes. No evidence of acute intracranial abnormality.  No evidence of traumatic injury to the cervical spine. Moderate multilevel degenerative changes   ____________________________________________   PROCEDURES  Procedure(s) performed: None  Critical Care performed: Yes, see critical care note(s)  CRITICAL CARE Performed by: Phineas Semen   Total critical care time: 35 minutes  Critical care time was exclusive of separately billable procedures and treating other patients.  Critical care was necessary to treat or prevent imminent or life-threatening deterioration.  Critical care was time spent personally by me on the following activities: development of treatment plan with patient and/or surrogate as well as nursing, discussions with consultants, evaluation of patient's response to treatment, examination of patient, obtaining history from patient or surrogate, ordering and performing treatments and interventions, ordering and review of laboratory studies, ordering and review of radiographic studies, pulse oximetry and re-evaluation of patient's condition.  ____________________________________________   INITIAL IMPRESSION / ASSESSMENT AND PLAN / ED COURSE  Pertinent labs & imaging results that  were available during my care of the patient were reviewed by me and considered in my medical decision making (see chart for details).  Patient presented to the emergency department today after being found down by her daughter. Patient is unable to give any history about what caused the fall. There is traumatic injury she suffered a small 1 cm superficial laceration to her left forehead. It was hemostatic. Will have the nurse placed Steri-Strips. Head and C-spine CT scans were negative. While here the patient did however have episodes of bradycardia. The patient's heart rate went down into the 40s. It would then, to the 70s. EKG did show that it was a sinus rhythm. I did try giving atropine which appeared to help somewhat with the rate. This point it is unclear if the patient might of fall because of symptomatic bradycardia. His possible the patient has sick sinus syndrome. In addition the patient's troponin was elevated. Furthermore the patient's temperature was noted to be quite hypothermic. Given this finding I did order broad-spectrum antibiotics in case it was a sign of infection. Patient will be admitted to the hospitalist service for further workup and evaluation.  ____________________________________________   FINAL CLINICAL IMPRESSION(S) / ED DIAGNOSES  Final diagnoses:  Sick sinus syndrome (  HCC)  Fall, initial encounter  Elevated troponin     Phineas SemenGraydon Walt Geathers, MD 08/21/15 1558

## 2015-08-20 NOTE — Progress Notes (Signed)
eLink Physician-Brief Progress Note Patient Name: [name] DOB: [birth year] MRN: [id number]   Date of Service  08/20/2015  HPI/Events of Note  81 yo female with PMH of HTN, GERD, CHF, CAD - s/p CABG and Cellulitis. Syncopal episode today d/t bradycardia. Given atropine in ED with HR now 82. Current medical regimen includes ASA, Rocephin, Lovenox, Claritin, Protonix and Zocor. O2 sat = 100% and RR = 21. BP = 130/58 and HR = 81. Management per Hospitalist Service.   eICU Interventions  Continue present management.      Intervention Category Evaluation Type: New Patient Evaluation  Lenell AntuSommer,Steven Eugene 08/20/2015, 10:09 PM

## 2015-08-20 NOTE — H&P (Signed)
[facility] [place] at [facility]   PATIENT NAME: [name] [name]    MR#:  [id number]  DATE OF BIRTH:  22-Jan-1920  DATE OF ADMISSION:  08/20/2015  PRIMARY CARE PHYSICIAN: Derwood KaplanEason,  Ernest B, MD   REQUESTING/REFERRING PHYSICIAN: Dr. Derrill KayGoodman  CHIEF COMPLAINT:   Chief Complaint  Patient presents with  . Fall    HISTORY OF PRESENT ILLNESS:  [name] [name]  is a 81 y.o. female presented after being found down on the ground by her daughter. The patient cannot give me any history about what happened. She thinks she was going to the kitchen for some coffee. The daughter went to the grocery store and was gone for about 45 minutes. The daughter could not get into the doorway because her mother's foot was blocking the door. The patient had a laceration of the left forehead. The patient currently feels okay and offers no complaints of chest pain. She does have some shortness of breath. In the ER, she was found to be hypothermic and bradycardic. In the ER they gave a dose of atropine and her heart rate had responded.  PAST MEDICAL HISTORY:   Past Medical History  Diagnosis Date  . Hypertension   . GERD (gastroesophageal reflux disease)   . CHF (congestive heart failure) (HCC)   . CAD (coronary artery disease)   . Cellulitis 03/2015    PAST SURGICAL HISTORY:   Past Surgical History  Procedure Laterality Date  . Rib fracture surgery    . Coronary artery bypass graft      SOCIAL HISTORY:   Social History  Substance Use Topics  . Smoking status: Former Games developermoker  . Smokeless tobacco: Never Used  . Alcohol Use: No    FAMILY HISTORY:   Family History  Problem Relation Age of Onset  . Hypertension Mother   . CAD Father     DRUG ALLERGIES:  No Known Allergies  REVIEW OF SYSTEMS:  CONSTITUTIONAL: No fever, fatigue or weakness.  EYES: No blurred or double vision.  EARS, NOSE, AND THROAT: No tinnitus or ear pain. No sore throat. Decreased  hearing RESPIRATORY: No cough, some shortness of breath, no wheezing or hemoptysis.  CARDIOVASCULAR: No chest pain, orthopnea, edema.  GASTROINTESTINAL: No nausea, vomiting, diarrhea or abdominal pain. No blood in bowel movements GENITOURINARY: No dysuria, hematuria.  ENDOCRINE: No polyuria, nocturia,  HEMATOLOGY: No anemia, easy bruising or bleeding SKIN: No rash or lesion. MUSCULOSKELETAL: No joint pain or arthritis.   NEUROLOGIC: No tingling, numbness, weakness. Possible syncope today PSYCHIATRY: No anxiety or depression.   MEDICATIONS AT HOME:   Prior to Admission medications   Medication Sig Start Date End Date Taking? Authorizing Provider  aspirin EC 325 MG tablet Take 325 mg by mouth daily.    Yes Historical Provider, MD  cetirizine (ZYRTEC) 10 MG tablet Take 10 mg by mouth daily as needed for allergies.    Yes Historical Provider, MD  furosemide (LASIX) 20 MG tablet Take 1 tablet (20 mg total) by mouth daily. 04/17/15  Yes Alford Highlandichard Annetta Deiss, MD  omeprazole (PRILOSEC) 20 MG capsule Take 20 mg by mouth daily.    Yes Historical Provider, MD  potassium chloride (K-DUR,KLOR-CON) 10 MEQ tablet Take 10 mEq by mouth daily.    Yes Historical Provider, MD  quinapril (ACCUPRIL) 20 MG tablet Take 20 mg by mouth daily.    Yes Historical Provider, MD  simvastatin (ZOCOR) 40 MG tablet Take 40 mg by mouth daily.    Yes Historical Provider,  MD      VITAL SIGNS:  Blood pressure 109/69, pulse 86, temperature 97.6 F (36.4 C), temperature source Rectal, resp. rate 22, SpO2 95 %.  PHYSICAL EXAMINATION:  GENERAL:  81 y.o.-year-old patient lying in the bed with no acute distress.  EYES: Pupils equal, round, reactive to light and accommodation. No scleral icterus. Extraocular muscles intact.  HEENT: Head - small laceration left forehand which bled into her hair., normocephalic. Oropharynx and nasopharynx clear.  NECK:  Supple, no jugular venous distention. No thyroid enlargement, no tenderness.   LUNGS: Decreased breath sounds bilaterally, no wheezing, rales,rhonchi or crepitation. No use of accessory muscles of respiration.  CARDIOVASCULAR: S1, S2 normal. 2/6 systolic murmur, no rubs, or gallops.  ABDOMEN: Soft, nontender, nondistended. Bowel sounds present. No organomegaly or mass.  EXTREMITIES: Trace edema, no cyanosis, or clubbing.  NEUROLOGIC: Cranial nerves II through XII are intact. Muscle strength 4 /5 in all extremities. Sensation intact. Gait not checked.  PSYCHIATRIC: The patient is alert and oriented x 3.  SKIN: Scaling bilateral lower extremities. Ulcer right medial ankle size larger than a quarter. No drainage.  LABORATORY PANEL:   CBC  Recent Labs Lab 08/20/15 1423  WBC 9.2  HGB 10.2*  HCT 33.5*  PLT 270   ------------------------------------------------------------------------------------------------------------------  Chemistries   Recent Labs Lab 08/20/15 1423  NA 140  K 3.9  CL 107  CO2 28  GLUCOSE 113*  BUN 35*  CREATININE 0.90  CALCIUM 9.1  AST 21  ALT 18  ALKPHOS 91  BILITOT 0.5   ------------------------------------------------------------------------------------------------------------------  Cardiac Enzymes  Recent Labs Lab 08/20/15 1423  TROPONINI 0.07*   ------------------------------------------------------------------------------------------------------------------  RADIOLOGY:  Ct Head Wo Contrast  08/20/2015  CLINICAL DATA:  Fall, 1 inch laceration to left forehead, mild pain at laceration site, neck pain EXAM: CT HEAD WITHOUT CONTRAST CT CERVICAL SPINE WITHOUT CONTRAST TECHNIQUE: Multidetector CT imaging of the head and cervical spine was performed following the standard protocol without intravenous contrast. Multiplanar CT image reconstructions of the cervical spine were also generated. COMPARISON:  None. FINDINGS: CT HEAD FINDINGS No evidence of parenchymal hemorrhage or extra-axial fluid collection. No mass lesion, mass  effect, or midline shift. No CT evidence of acute infarction. Subcortical white matter and periventricular small vessel ischemic changes. Intracranial atherosclerosis. Global cortical and central atrophy.  No ventriculomegaly. The visualized paranasal sinuses are essentially clear. The mastoid air cells are unopacified. Mild soft tissue swelling/hematoma overlying the left frontal bone (series 3/image 12). No evidence of calvarial fracture. CT CERVICAL SPINE FINDINGS Loss of the normal cervical lordosis. No evidence of fracture or dislocation. Vertebral body heights are maintained. Dens appears intact. Moderate multilevel degenerative changes. Visualized thyroid is unremarkable. Visualized lung apices are notable for right apical pleural parenchymal scarring (series 7/image 54). IMPRESSION: Mild soft tissue swelling/hematoma overlying the left frontal bone. No evidence of calvarial fracture. Atrophy with small vessel ischemic changes. No evidence of acute intracranial abnormality. No evidence of traumatic injury to the cervical spine. Moderate multilevel degenerative changes Electronically Signed   By: Charline Bills M.D.   On: 08/20/2015 14:25   Ct Cervical Spine Wo Contrast  08/20/2015  CLINICAL DATA:  Fall, 1 inch laceration to left forehead, mild pain at laceration site, neck pain EXAM: CT HEAD WITHOUT CONTRAST CT CERVICAL SPINE WITHOUT CONTRAST TECHNIQUE: Multidetector CT imaging of the head and cervical spine was performed following the standard protocol without intravenous contrast. Multiplanar CT image reconstructions of the cervical spine were also generated. COMPARISON:  None. FINDINGS: CT  HEAD FINDINGS No evidence of parenchymal hemorrhage or extra-axial fluid collection. No mass lesion, mass effect, or midline shift. No CT evidence of acute infarction. Subcortical white matter and periventricular small vessel ischemic changes. Intracranial atherosclerosis. Global cortical and central atrophy.  No  ventriculomegaly. The visualized paranasal sinuses are essentially clear. The mastoid air cells are unopacified. Mild soft tissue swelling/hematoma overlying the left frontal bone (series 3/image 12). No evidence of calvarial fracture. CT CERVICAL SPINE FINDINGS Loss of the normal cervical lordosis. No evidence of fracture or dislocation. Vertebral body heights are maintained. Dens appears intact. Moderate multilevel degenerative changes. Visualized thyroid is unremarkable. Visualized lung apices are notable for right apical pleural parenchymal scarring (series 7/image 54). IMPRESSION: Mild soft tissue swelling/hematoma overlying the left frontal bone. No evidence of calvarial fracture. Atrophy with small vessel ischemic changes. No evidence of acute intracranial abnormality. No evidence of traumatic injury to the cervical spine. Moderate multilevel degenerative changes Electronically Signed   By: Charline Bills M.D.   On: 08/20/2015 14:25    EKG:   Sinus bradycardia 40 bpm left bundle branch block  IMPRESSION AND PLAN:   1. Syncope and symptomatic bradycardia. We will watch closely for evidence of sick sinus syndrome. Patient's heart rate responded to one dose of atropine. ER physician spoke with cardiology Dr. Gwen Pounds to see the patient. Watch in CCU stepdown for now. 2. Hypothermia, acute cystitis with hematuria. ER physician ordered vancomycin and Zosyn and I will switch this over to Rocephin. I ordered urine culture. Follow-up blood cultures. Patient is on warmer at this point 3. Elevated troponin likely demand ischemia with the syncope and symptomatically bradycardia. Continue to monitor troponin. 4. Relative hypotension- hold blood pressure medications at this point 5. Hyperlipidemia unspecified continue low-dose Zocor  All the records are reviewed and case discussed with ED provider. Management plans discussed with the patient, family and they are in agreement.  CODE STATUS: Full  code  TOTAL TIME TAKING CARE OF THIS PATIENT: 55 minutes.    Alford Highland M.D on 08/20/2015 at 5:24 PM  Between 7am to 6pm - Pager - [phone]  After 6pm call admission pager [phone]  [facility]  Office  [phone]  CC: Primary care physician; Derwood Kaplan, MD

## 2015-08-20 NOTE — Progress Notes (Signed)
Anticoagulation monitoring(Lovenox):  81 yo female  ordered Lovenox 40 mg Q24h  Lab Results  Component Value Date   CREATININE 0.90 08/20/2015   CREATININE 0.89 08/10/2015   CREATININE 0.89 04/17/2015   Estimated Creatinine Clearance: 28.1 mL/min (by C-G formula based on Cr of 0.9). Hemoglobin & Hematocrit     Component Value Date/Time   HGB 10.2* 08/20/2015 1423   HCT 33.5* 08/20/2015 1423     Per Protocol for Patient with estCrcl< 30 ml/min and BMI < 40, will transition to Lovenox 30 mg Q24h.  Jodelle RedMary M Lasaundra Riche 7:14 PM

## 2015-08-20 NOTE — ED Notes (Signed)
Admitting MD at bedside.

## 2015-08-20 NOTE — ED Notes (Signed)
Patient fell at home and was found by daughter.  1" laceration to left side of forehead.  Patient denies any LOC but claims she has some mild pain at the laceration site and in her neck.  Patient's VS stable 158/64, irregular HR 60-90's.  Only medical history known of is CHF.  Patient alert and oriented at this time and in no apparent distress.

## 2015-08-20 NOTE — ED Notes (Signed)
Patient transported to CT 

## 2015-08-21 ENCOUNTER — Inpatient Hospital Stay
Admit: 2015-08-21 | Discharge: 2015-08-21 | Disposition: A | Discharge status: Home / Self Care | Payer: Medicare Other | Attending: Internal Medicine | Admitting: Internal Medicine

## 2015-08-21 DIAGNOSIS — E43 Unspecified severe protein-calorie malnutrition: Secondary | ICD-10-CM

## 2015-08-21 LAB — BASIC METABOLIC PANEL
Anion gap: 7 (ref 5–15)
BUN: 29 mg/dL — ABNORMAL HIGH (ref 6–20)
CHLORIDE: 107 mmol/L (ref 101–111)
CO2: 26 mmol/L (ref 22–32)
CREATININE: 0.83 mg/dL (ref 0.44–1.00)
Calcium: 8.5 mg/dL — ABNORMAL LOW (ref 8.9–10.3)
GFR, EST NON AFRICAN AMERICAN: 59 mL/min — AB (ref >60)
Glucose, Bld: 116 mg/dL — ABNORMAL HIGH (ref 65–99)
Potassium: 3.7 mmol/L (ref 3.5–5.1)
SODIUM: 140 mmol/L (ref 135–145)

## 2015-08-21 LAB — TROPONIN I: TROPONIN I: 0.07 ng/mL — AB (ref <0.031)

## 2015-08-21 LAB — CBC
HCT: 30.4 % — ABNORMAL LOW (ref 35.0–47.0)
Hemoglobin: 9.3 g/dL — ABNORMAL LOW (ref 12.0–16.0)
MCH: 22.8 pg — ABNORMAL LOW (ref 26.0–34.0)
MCHC: 30.6 g/dL — ABNORMAL LOW (ref 32.0–36.0)
MCV: 74.5 fL — AB (ref 80.0–100.0)
PLATELETS: 250 10*3/uL (ref 150–440)
RBC: 4.08 MIL/uL (ref 3.80–5.20)
RDW: 18.1 % — ABNORMAL HIGH (ref 11.5–14.5)
WBC: 9 10*3/uL (ref 3.6–11.0)

## 2015-08-21 LAB — LIPID PANEL
CHOL/HDL RATIO: 2.4 ratio
CHOLESTEROL: 130 mg/dL (ref 0–200)
HDL: 54 mg/dL (ref >40)
LDL Cholesterol: 64 mg/dL (ref 0–99)
Triglycerides: 60 mg/dL (ref <150)
VLDL: 12 mg/dL (ref 0–40)

## 2015-08-21 LAB — T4, FREE: Free T4: 1.13 ng/dL — ABNORMAL HIGH (ref 0.61–1.12)

## 2015-08-21 LAB — TSH: TSH: 5.498 u[IU]/mL — ABNORMAL HIGH (ref 0.350–4.500)

## 2015-08-21 MED ORDER — INFLUENZA VAC SPLIT QUAD 0.5 ML IM SUSY
0.5000 mL | PREFILLED_SYRINGE | INTRAMUSCULAR | Status: AC
  Administered 2015-08-22: 0.5 mL via INTRAMUSCULAR
  Filled 2015-08-21: qty 0.5

## 2015-08-21 MED ORDER — ENSURE ENLIVE PO LIQD
237.0000 mL | Freq: Two times a day (BID) | ORAL | Status: DC
  Administered 2015-08-21 – 2015-08-23 (×4): 237 mL via ORAL

## 2015-08-21 MED ORDER — MUPIROCIN 2 % EX OINT
TOPICAL_OINTMENT | Freq: Two times a day (BID) | CUTANEOUS | Status: DC
  Administered 2015-08-21 – 2015-08-23 (×6): via NASAL
  Filled 2015-08-21: qty 22

## 2015-08-21 MED ORDER — CETYLPYRIDINIUM CHLORIDE 0.05 % MT LIQD
7.0000 mL | Freq: Two times a day (BID) | OROMUCOSAL | Status: DC
  Administered 2015-08-21 – 2015-08-23 (×5): 7 mL via OROMUCOSAL

## 2015-08-21 MED ORDER — CHLORHEXIDINE GLUCONATE CLOTH 2 % EX PADS
6.0000 | MEDICATED_PAD | Freq: Every day | CUTANEOUS | Status: DC
  Administered 2015-08-21 – 2015-08-23 (×3): 6 via TOPICAL

## 2015-08-21 MED ORDER — CHLORHEXIDINE GLUCONATE 0.12 % MT SOLN
15.0000 mL | Freq: Two times a day (BID) | OROMUCOSAL | Status: DC
  Administered 2015-08-21 – 2015-08-23 (×6): 15 mL via OROMUCOSAL
  Filled 2015-08-21 (×9): qty 15

## 2015-08-21 MED ORDER — ENOXAPARIN SODIUM 40 MG/0.4ML ~~LOC~~ SOLN
40.0000 mg | SUBCUTANEOUS | Status: DC
  Administered 2015-08-21 – 2015-08-22 (×2): 40 mg via SUBCUTANEOUS
  Filled 2015-08-21 (×2): qty 0.4

## 2015-08-21 MED ORDER — MUPIROCIN 2 % EX OINT
1.0000 | TOPICAL_OINTMENT | Freq: Two times a day (BID) | CUTANEOUS | Status: DC
Start: 2015-08-21 — End: 2015-08-21
  Filled 2015-08-21: qty 22

## 2015-08-21 NOTE — Progress Notes (Addendum)
Pharmacy Antibiotic Follow-up Note  [name] [name] is a 81 y.o. year-old female admitted on 08/20/2015.  The patient is currently on day 1 of Rocephin for cystitis.  Assessment/Plan: [employer] returned Staph spp. x1 bottle, no mecA. Possible contaminant. Pt already on Rocephin. Will continue current regimen per conversation with hospitalist. Lab called with amended result, mecA+. Will start vancomycin per conversation with hospitalist.  Temp (24hrs), Avg:97.7 F (36.5 C), Min:96.6 F (35.9 C), Max:98.2 F (36.8 C)   Recent Labs Lab 08/20/15 1423 08/21/15 0423  WBC 9.2 9.0    Recent Labs Lab 08/20/15 1423 08/21/15 0423  CREATININE 0.90 0.83   Estimated Creatinine Clearance: 30.4 mL/min (by C-G formula based on Cr of 0.83).    No Known Allergies  Antimicrobials this admission:   Levels/dose changes this admission:   Microbiology results:    MRSA PCR: +  Thank you for allowing pharmacy to be a part of this patient's care.  Manda Holstad S PharmD 08/21/2015 11:43 PM

## 2015-08-21 NOTE — Progress Notes (Signed)
[facility] [place] at [facility]   PATIENT NAME: [name] [name]    MR#:  [id number]  DATE OF BIRTH:  04-13-1920  SUBJECTIVE:  CHIEF COMPLAINT:   Chief Complaint  Patient presents with  . Fall   patient is a 81 year old Caucasian female with past medical history significant for history of chronic congestive heart failure who was brought to the hospital because of weakness and was noted to have bradycardia with heart rate in 40s. EKG reveals sinus rhythm. Patient was given atropine in the emergency room and her heart rate improved. Apparently she's been having intermittent episodes of bradycardia while asleep as well. Heart rate has improved to 70s. At present. Patient complains of shortness of breath, attributes to her heart problems. Denies any chest pain  Review of Systems  Constitutional: Negative for fever, chills and weight loss.  HENT: Negative for congestion.   Eyes: Negative for blurred vision and double vision.  Respiratory: Positive for sputum production and shortness of breath. Negative for cough and wheezing.   Cardiovascular: Negative for chest pain, palpitations, orthopnea, leg swelling and PND.  Gastrointestinal: Negative for nausea, vomiting, abdominal pain, diarrhea, constipation and blood in stool.  Genitourinary: Negative for dysuria, urgency, frequency and hematuria.  Musculoskeletal: Negative for falls.  Neurological: Negative for dizziness, tremors, focal weakness and headaches.  Endo/Heme/Allergies: Does not bruise/bleed easily.  Psychiatric/Behavioral: Negative for depression. The patient does not have insomnia.     VITAL SIGNS: Blood pressure 122/95, pulse 76, temperature 97.9 F (36.6 C), temperature source Oral, resp. rate 19, height 5' (1.524 m), weight 47.4 kg (104 lb 8 oz), SpO2 100 %.  PHYSICAL EXAMINATION:   GENERAL:  81 y.o.-year-old patient lying in the bed with no acute distress.  EYES: Pupils equal, round, reactive to  light and accommodation. No scleral icterus. Extraocular muscles intact.  HEENT: Head atraumatic, normocephalic. Oropharynx and nasopharynx clear.  NECK:  Supple, no jugular venous distention. No thyroid enlargement, no tenderness.  LUNGS: Normal breath sounds bilaterally, no wheezing, rales,rhonchi or crepitation. Intermittent use of accessory muscles of respiration.  CARDIOVASCULAR: S1, S2 normal. 4/6 systolic precordial murmur, no rubs, or gallops.  ABDOMEN: Soft, nontender, nondistended. Bowel sounds present. No organomegaly or mass.  EXTREMITIES: No pedal edema, cyanosis, or clubbing.  NEUROLOGIC: Cranial nerves II through XII are intact. Muscle strength 5/5 in all extremities. Sensation intact. Gait not checked.  PSYCHIATRIC: The patient is alert and oriented x 3.  SKIN: No obvious rash, lesion, or ulcer.   ORDERS/RESULTS REVIEWED:   CBC  Recent Labs Lab 08/20/15 1423 08/21/15 0423  WBC 9.2 9.0  HGB 10.2* 9.3*  HCT 33.5* 30.4*  PLT 270 250  MCV 74.6* 74.5*  MCH 22.7* 22.8*  MCHC 30.4* 30.6*  RDW 18.3* 18.1*  LYMPHSABS 0.5*  --   MONOABS 0.5  --   EOSABS 0.1  --   BASOSABS 0.0  --    ------------------------------------------------------------------------------------------------------------------  Chemistries   Recent Labs Lab 08/20/15 1423 08/21/15 0423  NA 140 140  K 3.9 3.7  CL 107 107  CO2 28 26  GLUCOSE 113* 116*  BUN 35* 29*  CREATININE 0.90 0.83  CALCIUM 9.1 8.5*  AST 21  --   ALT 18  --   ALKPHOS 91  --   BILITOT 0.5  --    ------------------------------------------------------------------------------------------------------------------ estimated creatinine clearance is 30.4 mL/min (by C-G formula based on Cr of 0.83). ------------------------------------------------------------------------------------------------------------------  Recent Labs  08/21/15 0423  TSH 5.498*    Cardiac  Enzymes  Recent Labs Lab 08/20/15 1423 08/20/15 2025  08/21/15 0009  TROPONINI 0.07* 0.07* 0.07*   ------------------------------------------------------------------------------------------------------------------ Invalid input(s): POCBNP ---------------------------------------------------------------------------------------------------------------  RADIOLOGY: Ct Head Wo Contrast  08/20/2015  CLINICAL DATA:  Fall, 1 inch laceration to left forehead, mild pain at laceration site, neck pain EXAM: CT HEAD WITHOUT CONTRAST CT CERVICAL SPINE WITHOUT CONTRAST TECHNIQUE: Multidetector CT imaging of the head and cervical spine was performed following the standard protocol without intravenous contrast. Multiplanar CT image reconstructions of the cervical spine were also generated. COMPARISON:  None. FINDINGS: CT HEAD FINDINGS No evidence of parenchymal hemorrhage or extra-axial fluid collection. No mass lesion, mass effect, or midline shift. No CT evidence of acute infarction. Subcortical white matter and periventricular small vessel ischemic changes. Intracranial atherosclerosis. Global cortical and central atrophy.  No ventriculomegaly. The visualized paranasal sinuses are essentially clear. The mastoid air cells are unopacified. Mild soft tissue swelling/hematoma overlying the left frontal bone (series 3/image 12). No evidence of calvarial fracture. CT CERVICAL SPINE FINDINGS Loss of the normal cervical lordosis. No evidence of fracture or dislocation. Vertebral body heights are maintained. Dens appears intact. Moderate multilevel degenerative changes. Visualized thyroid is unremarkable. Visualized lung apices are notable for right apical pleural parenchymal scarring (series 7/image 54). IMPRESSION: Mild soft tissue swelling/hematoma overlying the left frontal bone. No evidence of calvarial fracture. Atrophy with small vessel ischemic changes. No evidence of acute intracranial abnormality. No evidence of traumatic injury to the cervical spine. Moderate multilevel  degenerative changes Electronically Signed   By: Charline Bills M.D.   On: 08/20/2015 14:25   Ct Cervical Spine Wo Contrast  08/20/2015  CLINICAL DATA:  Fall, 1 inch laceration to left forehead, mild pain at laceration site, neck pain EXAM: CT HEAD WITHOUT CONTRAST CT CERVICAL SPINE WITHOUT CONTRAST TECHNIQUE: Multidetector CT imaging of the head and cervical spine was performed following the standard protocol without intravenous contrast. Multiplanar CT image reconstructions of the cervical spine were also generated. COMPARISON:  None. FINDINGS: CT HEAD FINDINGS No evidence of parenchymal hemorrhage or extra-axial fluid collection. No mass lesion, mass effect, or midline shift. No CT evidence of acute infarction. Subcortical white matter and periventricular small vessel ischemic changes. Intracranial atherosclerosis. Global cortical and central atrophy.  No ventriculomegaly. The visualized paranasal sinuses are essentially clear. The mastoid air cells are unopacified. Mild soft tissue swelling/hematoma overlying the left frontal bone (series 3/image 12). No evidence of calvarial fracture. CT CERVICAL SPINE FINDINGS Loss of the normal cervical lordosis. No evidence of fracture or dislocation. Vertebral body heights are maintained. Dens appears intact. Moderate multilevel degenerative changes. Visualized thyroid is unremarkable. Visualized lung apices are notable for right apical pleural parenchymal scarring (series 7/image 54). IMPRESSION: Mild soft tissue swelling/hematoma overlying the left frontal bone. No evidence of calvarial fracture. Atrophy with small vessel ischemic changes. No evidence of acute intracranial abnormality. No evidence of traumatic injury to the cervical spine. Moderate multilevel degenerative changes Electronically Signed   By: Charline Bills M.D.   On: 08/20/2015 14:25   Dg Chest Port 1 View  08/20/2015  CLINICAL DATA:  Fall at home.  Hypothermia. EXAM: PORTABLE CHEST 1 VIEW  COMPARISON:  08/10/2015 FINDINGS: Prior CABG. Heart is normal size. Left perihilar and lower lobe airspace opacities. Right lower lobe opacities. Possible small effusions. No pneumothorax or acute bony abnormality. IMPRESSION: Bilateral lower lobe airspace opacities, left greater than right could reflect atelectasis or pneumonia. Suspect small effusions. Electronically Signed   By: Charlett Nose M.D.   On:  08/20/2015 17:44    EKG:  Orders placed or performed during the hospital encounter of 08/20/15  . EKG 12-Lead  . EKG 12-Lead  . EKG 12-Lead  . EKG 12-Lead    ASSESSMENT AND PLAN:  Active Problems:   Symptomatic bradycardia   Protein-calorie malnutrition, severe 1. Symptomatic bradycardia, etiology remains unclear, TSH was checked and was found to be elevated, although free T4 was also found to be elevated. Getting her echocardiogram as well as cardiology consultation. Patient is unlikely. Permanent Pacemaker candidate, continue supportive care 2. Dyspnea, likely due to chronic systolic CHF, continue outpatient medications and oxygen therapy, patient may benefit from oxygen therapy at home 3. Elevated troponin, likely demand ischemia, unable to use beta blockers due to bradycardia, add nitroglycerin if patient develops any chest pains 4. Hypothermia. On arrival, resolved at present, following closely. TSH is elevated, repeating free TSH/T4 tomorrow morning.    Management plans discussed with the patient, family and they are in agreement.   DRUG ALLERGIES: No Known Allergies  CODE STATUS:     Code Status Orders        Start     Ordered   08/20/15 1718  Full code   Continuous     08/20/15 1718      TOTAL TIME TAKING CARE OF THIS PATIENT: 40 minutes.    Katharina Caper M.D on 08/21/2015 at 1:57 PM  Between 7am to 6pm - Pager - [phone]  After 6pm go to [email or web address] - password [email or web address] [facility]  [place]  [facility]  Office  [phone]  CC: Primary care physician;  Derwood Kaplan, MD

## 2015-08-21 NOTE — Progress Notes (Signed)
Initial Nutrition Assessment  DOCUMENTATION CODES:   Severe malnutrition in context of acute illness/injury  INTERVENTION:   Meals and Snacks: Cater to patient preferences; recommend sending all meats chopped for ease of chewing (but not downgrading to dysphagia III as pt likes fresh fruit) Medical Food Supplement Therapy: pt likes Ensure; recommend sending BID  NUTRITION DIAGNOSIS:   Malnutrition related to acute illness as evidenced by percent weight loss, moderate depletion of body fat, moderate depletions of muscle mass.  GOAL:   Patient will meet greater than or equal to 90% of their needs  MONITOR:    (Energy Intake, Anthropometrics, Digestive System, Electrolyte/Renal Profile)  REASON FOR ASSESSMENT:   Malnutrition Screening Tool    ASSESSMENT:    Pt admitted s/p fall, syncope and symptomatic bradycardia, hypothermia with acute cystitis and hematuria  Past Medical History  Diagnosis Date  . Hypertension   . GERD (gastroesophageal reflux disease)   . CHF (congestive heart failure) (HCC)   . CAD (coronary artery disease)   . Cellulitis 03/2015    Diet Order:  Diet regular Room service appropriate?: Yes; Fluid consistency:: Thin   Energy Intake: pt reports very good appetite today, ate 100% at breakfast this AM; reports she ate 100% of sandwich tray during then night. On admission, pt reports she was very thirsty, all she wanted was water and could not get enough  Food and Nutrition Related History: pt reports appetite good at home but not able to quantify how much she eats. Pt likes Boost/Ensure but unable to tell if she drinks it regularly. Unsure who prepares meals at home; Pt lives with her son who is currently in the hospital but prior to this was in a wheelchair due to chronic wound on foot per pt and he does not do the cooking. Pt also mentions daughter, states she lives with her also but that her daughter never learned to cook and her food is not  good  Digestive System: pt with poor dentition, reports most of her teeth have "broken off"  Skin:  Reviewed, no issues  Electrolyte and Renal Profile:  Recent Labs Lab 08/20/15 1423 08/21/15 0423  BUN 35* 29*  CREATININE 0.90 0.83  NA 140 140  K 3.9 3.7   Glucose Profile:   Recent Labs  08/20/15 2048  GLUCAP 91   Meds: reviewed  Nutrition Focused Physical Exam: Nutrition-Focused physical exam completed. Findings are mild/moderate fat depletion, mild/moderate muscle depletion, and no edema.   Height:   Ht Readings from Last 1 Encounters:  08/20/15 5' (1.524 m)    Weight: 8% wt loss since October (12% wt loss possible this month) per weight encounters  Wt Readings from Last 1 Encounters:  08/20/15 104 lb 8 oz (47.4 kg)   Wt Readings from Last 10 Encounters:  08/20/15 104 lb 8 oz (47.4 kg)  08/10/15 118 lb (53.524 kg)  05/26/15 113 lb (51.256 kg)  04/25/15 113 lb (51.256 kg)  04/14/15 108 lb (48.988 kg)     BMI:  Body mass index is 20.41 kg/(m^2).  Estimated Nutritional Needs:   Kcal:  1040-1250 kcals (BEE 801, 1.3 AF, 1.0-1.2 IF)   Protein:  47-56 g (1.0-1.2 g/kg)   Fluid:  1175-1410 mL (25-30 ml/kg)   Romelle Starcherate Senta Kantor MS, RD, LDN [phone] Pager  [phone] Weekend/On-Call Pager

## 2015-08-21 NOTE — Clinical Social Work Note (Signed)
Clinical Social Work Assessment  Patient Details  Name: [name] [name] MRN: [id number] Date of Birth: Nov 27, 1919  Date of referral:  08/21/15               Reason for consult:  Facility Placement, Family Concerns                Permission sought to share information with:  Family Supports Permission granted to share information::  Yes, Verbal Permission Granted  Name::      Gerlene Burdock Lupinski/ [place]::     Relationship::   Daughter and church member   Contact Information:     Housing/Transportation Living arrangements for the past 2 months:  [place] of Information:  Patient, Adult Children Patient Interpreter Needed:  None Criminal Activity/Legal Involvement Pertinent to Current Situation/Hospitalization:  No - Comment as needed Significant Relationships:  Adult Children, Olivia Lopez de Gutierrez Lives with:  Adult Children Do you feel safe going back to the place where you live?  Yes Need for family participation in patient care:  No (Coment)  Care giving concerns:  Patient lives in [place] with her daughter Vaughan Basta and son Wilber Oliphant.    Social Worker assessment / plan: Holiday representative (CSW) is familiar with patient from last admission on 1A. CSW received consult that patient cannot care for herself and her son is at [facility] and being placed at [facility] or ALF. CSW met with patient to address consult. Patient was laying in bed and was alert and oriented. Patient smiled throughout assessment and was very pleasant. CSW introduced self and explained role of CSW department. Patient reported that she lives in [place] with her daughter Shauna Hugh and her son Wilber Oliphant. Diane is daughter's middle name and her first name is Vaughan Basta. Patient reported that her son is at [facility] and was on a vent. Patient reported that her daughter is at home now and spent 3 months at [facility]. Patient reported that her daughter can care for herself. Patient also reported that Reginia Forts is a  strong support for her and her family. Per patient she met Rip Harbour through her [employer]. Patient gave CSW permission to call Rip Harbour and her daughter Shauna Hugh. CSW discussed SNF placement for short term rehab. Patient shook her head no and reported that she wants to go home. CSW explained that PT will likely be ordered and complete an evaluation with patient at [facility]. CSW left a Advertising account executive for [place]. CSW contacted patient's daughter Shauna Hugh. Per daughter patient lives with her along with [place]. Daughter confirmed that Wilber Oliphant is at [facility] but did not know anything about placement. CSW will continue to follow and assist as needed.      Employment status:  Retired Forensic scientist:  Medicare PT Recommendations:  Not assessed at this time Information / Referral to community resources:     Patient/Family's Response to care:  At this time patient is not interested in SNF however PT consult should be ordered to address patient's mobility.   Patient/Family's Understanding of and Emotional Response to Diagnosis, Current Treatment, and Prognosis: Patient was pleasant throughout assessment and thanked CSW for visit.   Emotional Assessment Appearance:  Appears stated age Attitude/Demeanor/Rapport:    Affect (typically observed):  Pleasant Orientation:  Oriented to Self, Oriented to Place, Oriented to  Time, Oriented to Situation Alcohol / Substance use:  Not Applicable Psych involvement (Current and /or in the community):  No (Comment)  Discharge Needs  Concerns to be addressed:  Discharge Planning Concerns Readmission within the last 30 days:  No Current discharge risk:  Chronically ill Barriers to Discharge:  Continued Medical Work up   Elwyn Reach 08/21/2015, 3:19 PM

## 2015-08-21 NOTE — Progress Notes (Signed)
Report called to WestwoodJennifer, rn.  Pt being moved to room 143 on tele box.  Pt is alert, no complaints of pain.

## 2015-08-21 NOTE — Progress Notes (Signed)
Pt remained alert and oriented.  No complaints of pain.  Pt remained NSR with BBB. Pt's HR did dip to 39 when sound asleep but when awakened, HR went into the 80's.  Lungs are diminished, pt on 2L nasal cannula.  Pt rested well during the night.

## 2015-08-21 NOTE — Progress Notes (Signed)
When talking with pt. , pt explained to me that she lives with son and [place].  Son had home health come and help with cooking twice a week but now that son is in the hospital, home health does not come.  Pt was very hungry and stated to me that she had not had anything to eat in one day.  When she fell, daughter was at the grocery store buying food and when she returned, that's when the daughter found pt. Lying in floor according to pt.  Pt was unaccompanied to ICU.  Pt requested a peanut butter sandwich, chocolate pudding and a banana. Pt is on regular diet. I fed pt and pt ate the food in a hurry.  She ate everything I put on her plate and said that she felt so good now that she has had some food.

## 2015-08-21 NOTE — Progress Notes (Signed)
*  PRELIMINARY RESULTS* °Echocardiogram °2D Echocardiogram has been performed. ° °[name] [name] °08/21/2015, 4:50 PM °

## 2015-08-21 NOTE — Care Management (Signed)
Presnts from home.  Daughter found patient lying in the floor.  Received Atropine in the ED for heart rate in the low 40's.  Admitted to stepdown for close monitoring.  Concern for sick sinus syndrome.  Cardiology consult is pending

## 2015-08-21 NOTE — Progress Notes (Signed)
81 y/o F on Lovneox 30 mg daily for DVT prophylaxis.   CrCl= 30.4 ml/min, weight= 47.4 kg  Due to improved SCr, will increase Lovenox to 40 mg daily.

## 2015-08-21 NOTE — Progress Notes (Signed)
Patient had no bradycardic episodes today.  She has remained alert and oriented.  Eating 100% of meals.  UOP adequate.  Lungs diminished, mild SOB on exertion on 2LNC. Patient's clergy member/family friend [name] [name](Melinda Menz) came today to visit and reported to nursing staff that patient's daughter has paranoid schizophrenia and has trouble taking care of patient and patient's son.  Social work consult in to discuss d/c planning with patient.

## 2015-08-21 NOTE — Progress Notes (Signed)
Clinical Child psychotherapistocial Worker (CSW) received call back from PraxairMelinda [name] patient's friend from her church community. [facility]. Per [name] AlcideMelinda patient's son [name] [name] [facility] and the plan is [facility] [place]. Per [name] AlcideMelinda she is planning on coming to hospital this afternoon to visit patient. CSW will continue to follow and assist as needed.   [name], LCSWA [phone]) [phone]

## 2015-08-22 DIAGNOSIS — E039 Hypothyroidism, unspecified: Secondary | ICD-10-CM | POA: Diagnosis not present

## 2015-08-22 LAB — BLOOD CULTURE ID PANEL (REFLEXED)
ACINETOBACTER BAUMANNII: NOT DETECTED
CANDIDA ALBICANS: NOT DETECTED
CANDIDA GLABRATA: NOT DETECTED
CANDIDA TROPICALIS: NOT DETECTED
Candida krusei: NOT DETECTED
Candida parapsilosis: NOT DETECTED
Carbapenem resistance: NOT DETECTED
ENTEROBACTER CLOACAE COMPLEX: NOT DETECTED
ENTEROBACTERIACEAE SPECIES: NOT DETECTED
ENTEROCOCCUS SPECIES: NOT DETECTED
Escherichia coli: NOT DETECTED
HAEMOPHILUS INFLUENZAE: NOT DETECTED
KLEBSIELLA PNEUMONIAE: NOT DETECTED
Klebsiella oxytoca: NOT DETECTED
LISTERIA MONOCYTOGENES: NOT DETECTED
Methicillin resistance: DETECTED — AB
NEISSERIA MENINGITIDIS: NOT DETECTED
PSEUDOMONAS AERUGINOSA: NOT DETECTED
Proteus species: NOT DETECTED
STREPTOCOCCUS AGALACTIAE: NOT DETECTED
Serratia marcescens: NOT DETECTED
Staphylococcus aureus (BCID): NOT DETECTED
Staphylococcus species: DETECTED — AB
Streptococcus pneumoniae: NOT DETECTED
Streptococcus pyogenes: NOT DETECTED
Streptococcus species: NOT DETECTED
VANCOMYCIN RESISTANCE: NOT DETECTED

## 2015-08-22 LAB — TSH: TSH: 5.53 u[IU]/mL — AB (ref 0.350–4.500)

## 2015-08-22 LAB — T4, FREE: Free T4: 0.91 ng/dL (ref 0.61–1.12)

## 2015-08-22 MED ORDER — VANCOMYCIN HCL IN DEXTROSE 750-5 MG/150ML-% IV SOLN
750.0000 mg | Freq: Once | INTRAVENOUS | Status: AC
  Administered 2015-08-22: 750 mg via INTRAVENOUS
  Filled 2015-08-22 (×2): qty 150

## 2015-08-22 MED ORDER — LEVOTHYROXINE SODIUM 50 MCG PO TABS
25.0000 ug | ORAL_TABLET | Freq: Every day | ORAL | Status: DC
  Administered 2015-08-23: 25 ug via ORAL
  Filled 2015-08-22: qty 1

## 2015-08-22 MED ORDER — FUROSEMIDE 20 MG PO TABS
20.0000 mg | ORAL_TABLET | Freq: Once | ORAL | Status: AC
  Administered 2015-08-22: 20 mg via ORAL
  Filled 2015-08-22: qty 1

## 2015-08-22 MED ORDER — VANCOMYCIN HCL IN DEXTROSE 750-5 MG/150ML-% IV SOLN
750.0000 mg | INTRAVENOUS | Status: DC
  Filled 2015-08-22: qty 150

## 2015-08-22 MED ORDER — POTASSIUM CHLORIDE CRYS ER 20 MEQ PO TBCR
20.0000 meq | EXTENDED_RELEASE_TABLET | Freq: Once | ORAL | Status: AC
  Administered 2015-08-22: 20 meq via ORAL
  Filled 2015-08-22: qty 1

## 2015-08-22 NOTE — Care Management Note (Signed)
Case Management Note  Patient Details  Name: [name] [name] MRN: [id number] Date of Birth: 1920/01/07  Subjective/Objective:   Requested PT evaluation during progression                 Action/Plan:   Expected Discharge Date:                  Expected Discharge Plan:     In-House Referral:     Discharge planning Services     Post Acute Care Choice:    Choice offered to:     DME Arranged:    DME Agency:     HH Arranged:    HH Agency:     Status of Service:     Medicare Important Message Given:  Yes Date Medicare IM Given:    Medicare IM give by:    Date Additional Medicare IM Given:    Additional Medicare Important Message give by:     If discussed at Long Length of Stay Meetings, dates discussed:    Additional Comments:  Marily MemosLisa M Blayne Frankie, RN 08/22/2015, 12:44 PM

## 2015-08-22 NOTE — Discharge Instructions (Signed)
Heart Failure Clinic appointment on August 26, 2015 at 1:00pm with Clarisa Kindredina Chenika Nevils, FNP. Please call [phone] to reschedule.

## 2015-08-22 NOTE — Progress Notes (Signed)
CSW in to meet with patient to discuss possible SNF based on recommendation from PT.  Patient's friend  Ms. Rosita KeaMenz at bedside.  Patient ok for CSW to talk with her friend, states she is her support.  PT is currently pending, however patient is in agreement to go to SNF for rehab.  previously she did not want to go.  CSW will complete FL2 and fax out, will send PT notes once available.  Sammuel Hineseborah Moore. LCSWA Clinical Social Work Department [phone] 2:53 PM

## 2015-08-22 NOTE — Consult Note (Signed)
[facility]  CARDIOLOGY CONSULT NOTE  Patient ID: [name] [name] MRN: [id number] DOB/AGE: [birth year] 81 y.o.  Admit date: 08/20/2015 [facility] Phys[facility]   Primary Cardiologist  Dr. Juliann Paresallwood Reason for Consultation bradycardia and possible syncope  HPI: Pt is a 81 yo female who is a difficult historian who was admitted after being found on the floor at her home by her daughter. Pt is not sure if she had syncope or not. She was noted to be bradycardic on presentation to the er with heart rate in the 40's. She was on atenolol as outpatient. She was taken off of atenolol and heart rate has improved. She is currently hemodynamically stable with heart rates improved to the mid 70's. She has ruled out for an mi. Does not appear to require ppm at present. CXR reveals no cardiomegaly or pulmonary edema but small pleural effusions. Echo revealed ef of 40-45% with mild mr, mod tr and pleural effusion. TSH is 5.5. She has some brief episodes of bradycardia while sleeping but heart rate has been in the 70;s while awake.  ROS Review of Systems - History obtained from chart review and unobtainable from patient due to mental status General ROS: positive for  - confusion Respiratory ROS: no cough, shortness of breath, or wheezing Cardiovascular ROS: no chest pain or dyspnea on exertion Gastrointestinal ROS: no abdominal pain, change in bowel habits, or black or bloody stools Musculoskeletal ROS: negative Neurological ROS: no TIA or stroke symptoms   Past Medical History  Diagnosis Date  . Hypertension   . GERD (gastroesophageal reflux disease)   . CHF (congestive heart failure) (HCC)   . CAD (coronary artery disease)   . Cellulitis 03/2015    Family History  Problem Relation Age of Onset  . Hypertension Mother   . CAD Father     Social History   Social History  . Marital Status: Widowed    Spouse Name: N/A  . Number of Children:  N/A  . Years of Education: N/A   Occupational History  . Not on file.   Social History Main Topics  . Smoking status: Former Games developermoker  . Smokeless tobacco: Never Used  . Alcohol Use: No  . Drug Use: No  . Sexual Activity: Not on file   Other Topics Concern  . Not on file   Social History Narrative   Lives at home with son. Uses a walker    Past Surgical History  Procedure Laterality Date  . Rib fracture surgery    . Coronary artery bypass graft       Prescriptions prior to admission  Medication Sig Dispense Refill Last Dose  . aspirin EC 325 MG tablet Take 325 mg by mouth daily.    unknown at unknown  . cetirizine (ZYRTEC) 10 MG tablet Take 10 mg by mouth daily as needed for allergies.    PRN at PRN  . furosemide (LASIX) 20 MG tablet Take 1 tablet (20 mg total) by mouth daily. 30 tablet 0 unknown at unknown  . omeprazole (PRILOSEC) 20 MG capsule Take 20 mg by mouth daily.    unknown at unknown  . potassium chloride (K-DUR,KLOR-CON) 10 MEQ tablet Take 10 mEq by mouth daily.    unknown at unknown  . quinapril (ACCUPRIL) 20 MG tablet Take 20 mg by mouth daily.    unknown at unknown  . simvastatin (ZOCOR) 40 MG tablet Take 40 mg by mouth daily.    unknown  at unknown    Physical Exam: Blood pressure 125/55, pulse 79, temperature 97.3 F (36.3 C), temperature source Axillary, resp. rate 18, height 5' (1.524 m), weight 52.844 kg (116 lb 8 oz), SpO2 96 %.    General appearance: slowed mentation Resp: diminished breath sounds bibasilar and bilaterally Cardio: regular rate and rhythm GI: soft, non-tender; bowel sounds normal; no masses,  no organomegaly Extremities: ecchymosis on knees Pulses: 2+ and symmetric Neurologic: Mental status: alertness: lethargic, orientation: not oriented Labs:   Lab Results  Component Value Date   WBC 9.0 08/21/2015   HGB 9.3* 08/21/2015   HCT 30.4* 08/21/2015   MCV 74.5* 08/21/2015   PLT 250 08/21/2015    Recent Labs Lab 08/20/15 1423  08/21/15 0423  NA 140 140  K 3.9 3.7  CL 107 107  CO2 28 26  BUN 35* 29*  CREATININE 0.90 0.83  CALCIUM 9.1 8.5*  PROT 7.1  --   BILITOT 0.5  --   ALKPHOS 91  --   ALT 18  --   AST 21  --   GLUCOSE 113* 116*   Lab Results  Component Value Date   CKTOTAL 42 08/20/2015   TROPONINI 0.07* 08/21/2015      Radiology: no pulmonary edema small bilateral effusions EKG: sinus bradycardia transiently with now nsr in th 70's  ASSESSMENT AND PLAN:  81 yo with a fall which is of unclear etiology but may have been due to syncope or near syncope. She was noted to be bradycardic in the er. She appears to have been on atenolol based on her recent discharge note and outpatient notes from Dr. Juliann Pares. This has been held. She had had no further significant bradycardic episodes other than occationally while asleep. Would defer ppm for now and remain off of av nodal agents such as beta blockers. Would agree with nhp. Does not appear to be in florid pulmonary edema. EF is 40-45%. Does not appear to require any further cardiac work up as inpatient.  Signed: Dalia Heading MD, [facility] 08/22/2015, 9:49 AM

## 2015-08-22 NOTE — Progress Notes (Signed)
[facility] - [place] at [facility]   PATIENT NAME: [name] [name]    MR#:  [id number]  DATE OF BIRTH:  1920-03-28  SUBJECTIVE:  CHIEF COMPLAINT:   Chief Complaint  Patient presents with  . Fall   patient is a 81 year old Caucasian female with past medical history significant for history of chronic congestive heart failure who was brought to the hospital because of weakness and was noted to have bradycardia with heart rate in 40s. EKG reveals sinus rhythm. Patient was given atropine in the emergency room and her heart rate improved. Apparently she's been having intermittent episodes of bradycardia while asleep as well. Heart rate has improved to 70s. At present. Patient complains of shortness of breath, attributes to her heart problems. Denies any chest pain. Her cardiogram revealed tricuspid regurgitation and mild aortic as well as mitral regurgitations and ejection fraction of 40-45%. Cardiology does not recommend any other interventions, ambulate, and consider discharging home. Physical therapist is consulted for possible rehabilitation placement  Review of Systems  Constitutional: Negative for fever, chills and weight loss.  HENT: Negative for congestion.   Eyes: Negative for blurred vision and double vision.  Respiratory: Positive for sputum production and shortness of breath. Negative for cough and wheezing.   Cardiovascular: Negative for chest pain, palpitations, orthopnea, leg swelling and PND.  Gastrointestinal: Negative for nausea, vomiting, abdominal pain, diarrhea, constipation and blood in stool.  Genitourinary: Negative for dysuria, urgency, frequency and hematuria.  Musculoskeletal: Negative for falls.  Neurological: Negative for dizziness, tremors, focal weakness and headaches.  Endo/Heme/Allergies: Does not bruise/bleed easily.  Psychiatric/Behavioral: Negative for depression. The patient does not have insomnia.     VITAL SIGNS: Blood pressure  125/55, pulse 79, temperature 97.3 F (36.3 C), temperature source Axillary, resp. rate 18, height 5' (1.524 m), weight 52.844 kg (116 lb 8 oz), SpO2 96 %.  PHYSICAL EXAMINATION:   GENERAL:  81 y.o.-year-old patient lying in the bed with no acute distress.  EYES: Pupils equal, round, reactive to light and accommodation. No scleral icterus. Extraocular muscles intact.  HEENT: Head atraumatic, normocephalic. Oropharynx and nasopharynx clear.  NECK:  Supple, no jugular venous distention. No thyroid enlargement, no tenderness.  LUNGS: Normal breath sounds bilaterally, no wheezing, rales,rhonchi or crepitation. Intermittent use of accessory muscles of respiration.  CARDIOVASCULAR: S1, S2 normal. 4/6 systolic precordial murmur, no rubs, or gallops.  ABDOMEN: Soft, nontender, nondistended. Bowel sounds present. No organomegaly or mass.  EXTREMITIES: No pedal edema, cyanosis, or clubbing.  NEUROLOGIC: Cranial nerves II through XII are intact. Muscle strength 5/5 in all extremities. Sensation intact. Gait not checked.  PSYCHIATRIC: The patient is alert and oriented x 3.  SKIN: No obvious rash, lesion, or ulcer.   ORDERS/RESULTS REVIEWED:   CBC  Recent Labs Lab 08/20/15 1423 08/21/15 0423  WBC 9.2 9.0  HGB 10.2* 9.3*  HCT 33.5* 30.4*  PLT 270 250  MCV 74.6* 74.5*  MCH 22.7* 22.8*  MCHC 30.4* 30.6*  RDW 18.3* 18.1*  LYMPHSABS 0.5*  --   MONOABS 0.5  --   EOSABS 0.1  --   BASOSABS 0.0  --    ------------------------------------------------------------------------------------------------------------------  Chemistries   Recent Labs Lab 08/20/15 1423 08/21/15 0423  NA 140 140  K 3.9 3.7  CL 107 107  CO2 28 26  GLUCOSE 113* 116*  BUN 35* 29*  CREATININE 0.90 0.83  CALCIUM 9.1 8.5*  AST 21  --   ALT 18  --   ALKPHOS 91  --  BILITOT 0.5  --    ------------------------------------------------------------------------------------------------------------------ estimated  creatinine clearance is 30.4 mL/min (by C-G formula based on Cr of 0.83). ------------------------------------------------------------------------------------------------------------------  Recent Labs  08/22/15 0547  TSH 5.530*    Cardiac Enzymes  Recent Labs Lab 08/20/15 1423 08/20/15 2025 08/21/15 0009  TROPONINI 0.07* 0.07* 0.07*   ------------------------------------------------------------------------------------------------------------------ Invalid input(s): POCBNP ---------------------------------------------------------------------------------------------------------------  RADIOLOGY: Ct Head Wo Contrast  08/20/2015  CLINICAL DATA:  Fall, 1 inch laceration to left forehead, mild pain at laceration site, neck pain EXAM: CT HEAD WITHOUT CONTRAST CT CERVICAL SPINE WITHOUT CONTRAST TECHNIQUE: Multidetector CT imaging of the head and cervical spine was performed following the standard protocol without intravenous contrast. Multiplanar CT image reconstructions of the cervical spine were also generated. COMPARISON:  None. FINDINGS: CT HEAD FINDINGS No evidence of parenchymal hemorrhage or extra-axial fluid collection. No mass lesion, mass effect, or midline shift. No CT evidence of acute infarction. Subcortical white matter and periventricular small vessel ischemic changes. Intracranial atherosclerosis. Global cortical and central atrophy.  No ventriculomegaly. The visualized paranasal sinuses are essentially clear. The mastoid air cells are unopacified. Mild soft tissue swelling/hematoma overlying the left frontal bone (series 3/image 12). No evidence of calvarial fracture. CT CERVICAL SPINE FINDINGS Loss of the normal cervical lordosis. No evidence of fracture or dislocation. Vertebral body heights are maintained. Dens appears intact. Moderate multilevel degenerative changes. Visualized thyroid is unremarkable. Visualized lung apices are notable for right apical pleural parenchymal  scarring (series 7/image 54). IMPRESSION: Mild soft tissue swelling/hematoma overlying the left frontal bone. No evidence of calvarial fracture. Atrophy with small vessel ischemic changes. No evidence of acute intracranial abnormality. No evidence of traumatic injury to the cervical spine. Moderate multilevel degenerative changes Electronically Signed   By: Charline Bills M.D.   On: 08/20/2015 14:25   Ct Cervical Spine Wo Contrast  08/20/2015  CLINICAL DATA:  Fall, 1 inch laceration to left forehead, mild pain at laceration site, neck pain EXAM: CT HEAD WITHOUT CONTRAST CT CERVICAL SPINE WITHOUT CONTRAST TECHNIQUE: Multidetector CT imaging of the head and cervical spine was performed following the standard protocol without intravenous contrast. Multiplanar CT image reconstructions of the cervical spine were also generated. COMPARISON:  None. FINDINGS: CT HEAD FINDINGS No evidence of parenchymal hemorrhage or extra-axial fluid collection. No mass lesion, mass effect, or midline shift. No CT evidence of acute infarction. Subcortical white matter and periventricular small vessel ischemic changes. Intracranial atherosclerosis. Global cortical and central atrophy.  No ventriculomegaly. The visualized paranasal sinuses are essentially clear. The mastoid air cells are unopacified. Mild soft tissue swelling/hematoma overlying the left frontal bone (series 3/image 12). No evidence of calvarial fracture. CT CERVICAL SPINE FINDINGS Loss of the normal cervical lordosis. No evidence of fracture or dislocation. Vertebral body heights are maintained. Dens appears intact. Moderate multilevel degenerative changes. Visualized thyroid is unremarkable. Visualized lung apices are notable for right apical pleural parenchymal scarring (series 7/image 54). IMPRESSION: Mild soft tissue swelling/hematoma overlying the left frontal bone. No evidence of calvarial fracture. Atrophy with small vessel ischemic changes. No evidence of acute  intracranial abnormality. No evidence of traumatic injury to the cervical spine. Moderate multilevel degenerative changes Electronically Signed   By: Charline Bills M.D.   On: 08/20/2015 14:25   Dg Chest Port 1 View  08/20/2015  CLINICAL DATA:  Fall at home.  Hypothermia. EXAM: PORTABLE CHEST 1 VIEW COMPARISON:  08/10/2015 FINDINGS: Prior CABG. Heart is normal size. Left perihilar and lower lobe airspace opacities. Right lower lobe opacities. Possible small effusions.  No pneumothorax or acute bony abnormality. IMPRESSION: Bilateral lower lobe airspace opacities, left greater than right could reflect atelectasis or pneumonia. Suspect small effusions. Electronically Signed   By: Charlett NoseKevin  Dover M.D.   On: 08/20/2015 17:44    EKG:  Orders placed or performed during the hospital encounter of 08/20/15  . EKG 12-Lead  . EKG 12-Lead  . EKG 12-Lead  . EKG 12-Lead    ASSESSMENT AND PLAN:  Active Problems:   Symptomatic bradycardia   Protein-calorie malnutrition, severe 1. Symptomatic bradycardia, etiology likely is hypothyroidism , remains unclear, TSH was found to be high . Initiate low-dose of Synthroid .  echocardiogram revealed ejection fraction of 40-45% and moderate tricuspid regurgitation , appreciate  cardiology consultation. Follow clinically. No further interventions 2. Dyspnea, likely due to chronic systolic CHF, continue outpatient medications and oxygen therapy, patient may benefit from oxygen therapy at home, get exertional oximetry before discharge home.  3. Elevated troponin, likely demand ischemia, supportive care only  4. Hypothermia. On arrival, resolved at present, early due to hypothyroidism 5. Hypothyroidism. Initiate Synthroid 6. Generalized weakness, physical therapist, is consulted. May need of irritation placement 7. 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    Management plans discussed with  the patient, family and they are in agreement.   DRUG ALLERGIES: No Known Allergies  CODE STATUS:     Code Status Orders        Start     Ordered   08/20/15 1718  Full code   Continuous     08/20/15 1718      TOTAL TIME TAKING CARE OF THIS PATIENT: 40 minutes.    Katharina CaperVAICKUTE,Nancy Manuele M.D on 08/22/2015 at 1:42 PM  Between 7am to 6pm - Pager - [phone]  After 6pm go to [email or web address] - password [email or web address] [facility]  [facility]  Office  [phone]  CC: Primary care physician; Derwood KaplanEason,  Ernest B, MD

## 2015-08-22 NOTE — Evaluation (Signed)
Physical Therapy Evaluation Patient Details Name: [name] [name] MRN: [id number] DOB: 08/02/1920 Today's Date: 08/22/2015   History of Present Illness  presented to ER after being found on floor in home environment (status post fall), noted with hypothermia and bradycardia (responsive to atropine in ER); admitted for management of symptomatic bradycardia and acute cystitis.  Has continued to have occasional bradycardic episodes during sleep, but much improved since off atenolol.  Clinical Impression  Upon evaluation, patient alert and oriented to basic information; follows simple commands.  Bilat UE/LE strength generally weak and deconditioned, but functional for basic transfers and limited mobility.  Currently requiring min/mod assist for all bed mobility, sit/stand, basic transfers and short-distance gait (5') with RW.  Very unsteady and unsafe; do not recommend attempting without +1 assist at allt imes.  Significant SOB with minimal exertion with HR elevation to 120s; sats remain >94% on 2L supplemental O2.  Additional activity deferred as a result (with HR return to 100s upon return to supine).  RN informed/aware. Would benefit from skilled PT to address above deficits and promote optimal return to PLOF; recommend transition to STR upon discharge from acute hospitalization.     Follow Up Recommendations SNF    Equipment Recommendations       Recommendations for Other Services       Precautions / Restrictions Precautions Precautions: Fall Restrictions Weight Bearing Restrictions: No      Mobility  Bed Mobility Overal bed mobility: Needs Assistance Bed Mobility: Supine to Sit;Sit to Supine     Supine to sit: Min assist Sit to supine: Min assist      Transfers Overall transfer level: Needs assistance Equipment used: Rolling walker (2 wheeled) Transfers: Sit to/from Stand Sit to Stand: Min assist;Mod assist            Ambulation/Gait Ambulation/Gait assistance: Min  assist;Mod assist Ambulation Distance (Feet): 5 Feet Assistive device: Rolling walker (2 wheeled)       General Gait Details: very forward flexed, kyphotic posture; short, shuffling steps with poor balance reactions.  Very high fall risk.  Significant SOB with minimal exertion, but sats remain >94% on supplemental O2; HR elevated to 120s.  Additional activity deferred as a result.  Stairs            Wheelchair Mobility    Modified Rankin (Stroke Patients Only)       Balance Overall balance assessment: Needs assistance Sitting-balance support: No upper extremity supported;Feet supported Sitting balance-Leahy Scale: Fair     Standing balance support: Bilateral upper extremity supported Standing balance-Leahy Scale: Poor                               Pertinent Vitals/Pain Pain Assessment: No/denies pain (generalized soreness)    Home Living Family/patient expects to be discharged to:: Private residence Living Arrangements: Children Available Help at Discharge: Friend(s);Family (support from church family) Type of Home: House Home Access: Ramped entrance     Home Layout: One level Home Equipment: Environmental consultant - 4 wheels;Bedside commode;Shower seat      Prior Function Level of Independence: Independent with assistive device(s);Needs assistance   Gait / Transfers Assistance Needed: pt "furniture cruises" in home and uses rollator in community; pt currently taking sponge baths  ADL's / Homemaking Assistance Needed: pt's pastor and church members assist pt with grocery shopping, errands, dishes        Hand Dominance        Extremity/Trunk Assessment  Upper Extremity Assessment: Generalized weakness           Lower Extremity Assessment: Generalized weakness         Communication   Communication: HOH  Cognition Arousal/Alertness: Awake/alert Behavior During Therapy: WFL for tasks assessed/performed Overall Cognitive Status: Within Functional  Limits for tasks assessed                      General Comments      Exercises Other Exercises Other Exercises: Toilet transfer, SPT with RW, min/mod assist +1; [facility] and static standing balance with RW, min/mod assist. Very weak and unsteady; dep for hygiene.      Assessment/Plan    PT Assessment Patient needs continued PT services  PT Diagnosis Difficulty walking;Generalized weakness   PT Problem List Decreased strength;Decreased range of motion;Decreased activity tolerance;Decreased balance;Decreased mobility;Decreased knowledge of use of DME;Decreased safety awareness;Decreased knowledge of precautions;Cardiopulmonary status limiting activity  PT Treatment Interventions DME instruction;Gait training;Stair training;Functional mobility training;Therapeutic activities;Therapeutic exercise;Balance training;Patient/family education   PT Goals (Current goals can be found in the Care Plan section) Acute Rehab PT Goals Patient Stated Goal: patient did not verbalize PT Goal Formulation: With patient Time For Goal Achievement: 09/05/15 Potential to Achieve Goals: Fair    Frequency Min 2X/week   Barriers to discharge        Co-evaluation               End of Session Equipment Utilized During Treatment: Gait belt Activity Tolerance: Patient limited by fatigue Patient left: in bed;with call bell/phone within reach;with bed alarm set Nurse Communication: Mobility status (vitals response to activity)         Time: 1536-1600 PT Time Calculation (min) (ACUTE ONLY): 24 min   Charges:   PT Evaluation $Initial PT Evaluation Tier I: 1 Procedure PT Treatments $Therapeutic Activity: 8-22 mins   PT G Codes:       [name] [name], PT, DPT, NCS 08/22/2015, 5:11 PM [phone]

## 2015-08-22 NOTE — Care Management Important Message (Signed)
Important Message  Patient Details  Name: [name] [name] MRN: [id number] Date of Birth: September 05, 1919   Medicare Important Message Given:  Yes    Raihana Balderrama A, RN 08/22/2015, 9:41 AM

## 2015-08-22 NOTE — Progress Notes (Signed)
Patient currently is followed at [facility] and has an appointment scheduled for August 26, 2015 at 1:00pm. Thank you.

## 2015-08-22 NOTE — NC FL2 (Signed)
[place] MEDICAID FL2 LEVEL OF CARE SCREENING TOOL     IDENTIFICATION  Patient Name: [name] [name] Birthdate: 07-Apr-1920 Sex: female Admission Date (Current Location): 08/20/2015  [place]   Facility and Address:  [facility], [address], [place], [place] [address]      Provider Number: [id number]  Attending Physician Name and Address:  Katharina Caperima Vaickute, MD  Relative Name and Phone Number:       Current Level of Care: Hospital Recommended Level of Care: Skilled Nursing Facility Prior Approval Number:    Date Approved/Denied:   PASRR Number:  [id number])  Discharge Plan: SNF    Current Diagnoses: Patient Active Problem List   Diagnosis Date Noted  . Protein-calorie malnutrition, severe 08/21/2015  . Symptomatic bradycardia 08/20/2015  . Hyperlipidemia 05/27/2015  . HTN (hypertension) 05/27/2015  . Chronic systolic heart failure (HCC) 04/25/2015  . Circulation problem 04/25/2015  . Cellulitis 04/14/2015    Orientation RESPIRATION BLADDER Height & Weight    Self, Time, Situation, Place  O2 Continent 5\' 1"  (154.9 cm) 104 lbs.  BEHAVIORAL SYMPTOMS/MOOD NEUROLOGICAL BOWEL NUTRITION STATUS      Continent Diet  AMBULATORY STATUS COMMUNICATION OF NEEDS Skin   Extensive Assist Verbally Surgical wounds, Skin abrasions, Bruising                       Personal Care Assistance Level of Assistance  Bathing, Feeding, Dressing Bathing Assistance: Maximum assistance Feeding assistance: Limited assistance Dressing Assistance: Maximum assistance     Functional Limitations Info  Sight, Hearing, Speech Sight Info: Adequate Hearing Info: Impaired Speech Info: Adequate    SPECIAL CARE FACTORS FREQUENCY  PT (By licensed PT)                    Contractures      Additional Factors Info  Allergies, Isolation Precautions   Allergies Info:  (NKA)     Isolation Precautions Info:  (08/20/15 mrsa pcr  +)     Current Medications (08/22/2015):  This is the current hospital active medication list Current Facility-Administered Medications  Medication Dose Route Frequency Provider Last Rate Last Dose  . acetaminophen (TYLENOL) tablet 650 mg  650 mg Oral Q6H PRN Alford Highlandichard Wieting, MD   650 mg at 08/21/15 1020   Or  . acetaminophen (TYLENOL) suppository 650 mg  650 mg Rectal Q6H PRN Alford Highlandichard Wieting, MD      . antiseptic oral rinse (CPC / CETYLPYRIDINIUM CHLORIDE 0.05%) solution 7 mL  7 mL Mouth Rinse q12n4p Katharina Caperima Vaickute, MD   7 mL at 08/22/15 1212  . aspirin EC tablet 325 mg  325 mg Oral Daily Alford Highlandichard Wieting, MD   325 mg at 08/22/15 1023  . atropine 0.1 MG/ML injection 0.5 mg  0.5 mg Intravenous PRN Alford Highlandichard Wieting, MD      . chlorhexidine (PERIDEX) 0.12 % solution 15 mL  15 mL Mouth Rinse BID Katharina Caperima Vaickute, MD   15 mL at 08/22/15 1022  . Chlorhexidine Gluconate Cloth 2 % PADS 6 each  6 each Topical Q0600 Katharina Caperima Vaickute, MD   6 each at 08/22/15 0607  . enoxaparin (LOVENOX) injection 40 mg  40 mg Subcutaneous Q24H Katharina Caperima Vaickute, MD   40 mg at 08/21/15 2035  . feeding supplement (ENSURE ENLIVE) (ENSURE ENLIVE) liquid 237 mL  237 mL Oral BID WC Katharina Caperima Vaickute, MD   237 mL at 08/22/15 1022  . [START ON 08/23/2015] levothyroxine (SYNTHROID, LEVOTHROID)  tablet 25 mcg  25 mcg Oral QAC breakfast Katharina Caper, MD      . loratadine (CLARITIN) tablet 10 mg  10 mg Oral Daily Alford Highland, MD   10 mg at 08/22/15 1023  . mupirocin ointment (BACTROBAN) 2 %   Nasal BID Katharina Caper, MD      . pantoprazole (PROTONIX) EC tablet 40 mg  40 mg Oral QAC breakfast Alford Highland, MD   40 mg at 08/22/15 1023  . simvastatin (ZOCOR) tablet 40 mg  40 mg Oral q1800 Alford Highland, MD   40 mg at 08/21/15 1748  . sodium chloride 0.9 % injection 3 mL  3 mL Intravenous Q12H Alford Highland, MD   3 mL at 08/22/15 1024     Discharge Medications: Please see discharge summary for a list of discharge medications.  Relevant  Imaging Results:  Relevant Lab Results:   Additional Information  ([id number])  Soundra Pilon, LCSW

## 2015-08-22 NOTE — Progress Notes (Signed)
ANTIBIOTIC CONSULT NOTE - INITIAL  Pharmacy Consult for vancomycin dosing Indication: Positive PCR blood result  No Known Allergies  Patient Measurements: Height: 5' (152.4 cm) Weight: 104 lb 8 oz (47.4 kg) IBW/kg (Calculated) : 45.5 Adjusted Body Weight: 47.4kg  Vital Signs: Temp: 97.4 F (36.3 C) (12/30 0008) Temp Source: Oral (12/30 0008) BP: 111/42 mmHg (12/30 0008) Pulse Rate: 40 (12/30 0008) Intake/Output from previous day: 12/29 0701 - 12/30 0700 In: 830 [P.O.:780; IV Piggyback:50] Out: 500 [Urine:500] Intake/Output from this shift: Total I/O In: -  Out: 275 [Urine:275]  Labs:  Recent Labs  08/20/15 1423 08/21/15 0423  WBC 9.2 9.0  HGB 10.2* 9.3*  PLT 270 250  CREATININE 0.90 0.83   Estimated Creatinine Clearance: 30.4 mL/min (by C-G formula based on Cr of 0.83). No results for input(s): VANCOTROUGH, VANCOPEAK, VANCORANDOM, GENTTROUGH, GENTPEAK, GENTRANDOM, TOBRATROUGH, TOBRAPEAK, TOBRARND, AMIKACINPEAK, AMIKACINTROU, AMIKACIN in the last 72 hours.   Microbiology: Recent Results (from the past 720 hour(s))  Urine culture     Status: None (Preliminary result)   Collection Time: 08/20/15  3:07 PM  Result Value Ref Range Status   Specimen Description URINE, CATHETERIZED  Final   Special Requests Normal  Final   Culture TOO YOUNG TO READ  Final   Report Status PENDING  Incomplete  Blood culture (routine x 2)     Status: None (Preliminary result)   Collection Time: 08/20/15  3:10 PM  Result Value Ref Range Status   Specimen Description BLOOD RIGHT ASSIST CONTROL  Final   Special Requests BOTTLES DRAWN AEROBIC AND ANAEROBIC 2CCAERO,1CCANA  Final   Culture NO GROWTH < 24 HOURS  Final   Report Status PENDING  Incomplete  Blood culture (routine x 2)     Status: None   Collection Time: 08/20/15  3:10 PM  Result Value Ref Range Status   Specimen Description BLOOD LEFT ASSIST CONTROL  Final   Special Requests BOTTLES DRAWN AEROBIC AND ANAEROBIC 3CCAERO,3CCANA   Final   Culture  Setup Time   Final    GRAM POSITIVE COCCI ANAEROBIC BOTTLE ONLY Organism ID to follow CONFIRMED BY SDR/TFK CRITICAL RESULT CALLED TO, READ BACK BY AND VERIFIED WITH: MATT MACBANE, PHARMACIST ON 08/21/15 AT 1134PM BY TLB    Culture   Final    GRAM POSITIVE COCCI ANAEROBIC BOTTLE ONLY IDENTIFICATION TO FOLLOW    Report Status 08/21/2015 FINAL  Final  Blood Culture ID Panel (Reflexed)     Status: Abnormal   Collection Time: 08/20/15  3:10 PM  Result Value Ref Range Status   Enterococcus species NOT DETECTED NOT DETECTED Final   Listeria monocytogenes NOT DETECTED NOT DETECTED Final   Staphylococcus species DETECTED (A) NOT DETECTED Final    Comment: CRITICAL RESULT CALLED TO, READ BACK BY AND VERIFIED WITH: MATT Rjay Revolorio, PHARMACIST ON 08/21/15 AT 1134PM BY TLB    Staphylococcus aureus NOT DETECTED NOT DETECTED Final   Streptococcus species NOT DETECTED NOT DETECTED Final   Streptococcus agalactiae NOT DETECTED NOT DETECTED Final   Streptococcus pneumoniae NOT DETECTED NOT DETECTED Final   Streptococcus pyogenes NOT DETECTED NOT DETECTED Final   Acinetobacter baumannii NOT DETECTED NOT DETECTED Final   Enterobacteriaceae species NOT DETECTED NOT DETECTED Final   Enterobacter cloacae complex NOT DETECTED NOT DETECTED Final   Escherichia coli NOT DETECTED NOT DETECTED Final   Klebsiella oxytoca NOT DETECTED NOT DETECTED Final   Klebsiella pneumoniae NOT DETECTED NOT DETECTED Final   Proteus species NOT DETECTED NOT DETECTED Final  Serratia marcescens NOT DETECTED NOT DETECTED Final   Haemophilus influenzae NOT DETECTED NOT DETECTED Final   Neisseria meningitidis NOT DETECTED NOT DETECTED Final   Pseudomonas aeruginosa NOT DETECTED NOT DETECTED Final   Candida albicans NOT DETECTED NOT DETECTED Final   Candida glabrata NOT DETECTED NOT DETECTED Final   Candida krusei NOT DETECTED NOT DETECTED Final   Candida parapsilosis NOT DETECTED NOT DETECTED Final   Candida  tropicalis NOT DETECTED NOT DETECTED Final   Carbapenem resistance NOT DETECTED NOT DETECTED Final   Methicillin resistance DETECTED (A) NOT DETECTED Final    Comment: CRITICAL RESULT CALLED TO, READ BACK BY AND VERIFIED WITH: MATT Kayliah Tindol, PHARMACIST ON 08/21/15 AT 1134PM BY TLB    Vancomycin resistance NOT DETECTED NOT DETECTED Final  MRSA PCR Screening     Status: Abnormal   Collection Time: 08/20/15 10:16 PM  Result Value Ref Range Status   MRSA by PCR POSITIVE (A) NEGATIVE Final    Comment: CRITICAL RESULT CALLED TO, READ BACK BY AND VERIFIED WITH: RENEE BABB ON 08/20/15 AT 2335 PM BY TLB        The GeneXpert MRSA Assay (FDA approved for NASAL specimens only), is one component of a comprehensive MRSA colonization surveillance program. It is not intended to diagnose MRSA infection nor to guide or monitor treatment for MRSA infections.     Medical History: Past Medical History  Diagnosis Date  . Hypertension   . GERD (gastroesophageal reflux disease)   . CHF (congestive heart failure) (HCC)   . CAD (coronary artery disease)   . Cellulitis 03/2015    Medications:   Assessment: Biofire PCR: Staph. spp., mec A+, 1/2 bottles Urine cx pending CXR: BLL opacities, atelactasis vs. pneumonia   Goal of Therapy:  Vancomycin trough level 15-20 mcg/ml  Plan:  TBW 47.4kg  IBW 45.5kg  DW 47.4kg  Vd 33L kei 0.029 hr-1  t1/2 24 hours 750 mg IV q 36 hours ordered with stacked dosing. Level before 4th dose.  [name] [name] S 08/22/2015,12:31 AM

## 2015-08-23 DIAGNOSIS — R7989 Other specified abnormal findings of blood chemistry: Secondary | ICD-10-CM

## 2015-08-23 DIAGNOSIS — R778 Other specified abnormalities of plasma proteins: Secondary | ICD-10-CM

## 2015-08-23 DIAGNOSIS — R9430 Abnormal result of cardiovascular function study, unspecified: Secondary | ICD-10-CM

## 2015-08-23 DIAGNOSIS — E039 Hypothyroidism, unspecified: Secondary | ICD-10-CM

## 2015-08-23 DIAGNOSIS — R531 Weakness: Secondary | ICD-10-CM

## 2015-08-23 DIAGNOSIS — I429 Cardiomyopathy, unspecified: Secondary | ICD-10-CM

## 2015-08-23 DIAGNOSIS — T68XXXA Hypothermia, initial encounter: Secondary | ICD-10-CM

## 2015-08-23 DIAGNOSIS — R0600 Dyspnea, unspecified: Secondary | ICD-10-CM

## 2015-08-23 LAB — URINE CULTURE
Culture: >=100000
Special Requests: NORMAL

## 2015-08-23 LAB — CREATININE, SERUM
CREATININE: 0.64 mg/dL (ref 0.44–1.00)
GFR calc Af Amer: >60 mL/min (ref >60)

## 2015-08-23 MED ORDER — LEVOTHYROXINE SODIUM 25 MCG PO TABS: 25.0000 ug | ORAL_TABLET | Freq: Every day | ORAL | Status: AC

## 2015-08-23 MED ORDER — ASPIRIN EC 81 MG PO TBEC: 81.0000 mg | DELAYED_RELEASE_TABLET | Freq: Every day | ORAL | Status: AC

## 2015-08-23 MED ORDER — CETYLPYRIDINIUM CHLORIDE 0.05 % MT LIQD: 7.0000 mL | Freq: Two times a day (BID) | OROMUCOSAL | Status: AC

## 2015-08-23 MED ORDER — ENSURE ENLIVE PO LIQD: 237.0000 mL | Freq: Two times a day (BID) | ORAL | Status: AC

## 2015-08-23 MED ORDER — MUPIROCIN 2 % EX OINT: TOPICAL_OINTMENT | Freq: Two times a day (BID) | CUTANEOUS | Status: AC

## 2015-08-23 MED ORDER — CHLORHEXIDINE GLUCONATE 0.12 % MT SOLN: 15.0000 mL | Freq: Two times a day (BID) | OROMUCOSAL | Status: AC

## 2015-08-23 MED ORDER — BISACODYL 10 MG RE SUPP
10.0000 mg | Freq: Every day | RECTAL | Status: DC | PRN
  Administered 2015-08-23: 10 mg via RECTAL
  Filled 2015-08-23: qty 1

## 2015-08-23 NOTE — Progress Notes (Signed)
Report given to Camera operatorwann RN at [facility]. 911 called for non emergent transfer. IV and tele removed. Pt is ready for discharge.

## 2015-08-23 NOTE — Discharge Summary (Addendum)
[facility]   PATIENT NAME: [name] DoingMedia Worst    MR#:  [id number]  DATE OF BIRTH:  01/31/1920  DATE OF ADMISSION:  08/20/2015 ADMITTING PHYSICIAN: Alford Highlandichard Wieting, MD  DATE OF DISCHARGE: 31st of December 2016  PRIMARY CARE PHYSICIAN: Derwood KaplanEason,  Ernest B, MD     ADMISSION DIAGNOSIS:  Sick sinus syndrome (HCC) [I49.5] Hypothermia [T68.XXXA] Elevated troponin [R79.89] Fall, initial encounter [id number][W19.XXXA]  DISCHARGE DIAGNOSIS:  Principal Problem:   Symptomatic bradycardia Active Problems:   Protein-calorie malnutrition, severe   Hypothyroidism   Generalized weakness   Cardiomyopathy (HCC)   Cardiac LV ejection fraction >40%   Dyspnea   Elevated troponin   Hypothermia   SECONDARY DIAGNOSIS:   Past Medical History  Diagnosis Date  . Hypertension   . GERD (gastroesophageal reflux disease)   . CHF (congestive heart failure) (HCC)   . CAD (coronary artery disease)   . Cellulitis 03/2015    .pro HOSPITAL COURSE:   Patient is 81 year old Caucasian female with past medical history significant for history of essential hypertension, gastroesophageal reflux disease, congestive heart failure, coronary artery disease who presents to the hospital with complaints of weakness and fall. On arrival to emergency room, she was noted to be hypothermic and bradycardic. She was given 1 dose of atropine for heart rate of 45 and her heart rate responded. Patient was admitted to the hospital for further evaluation and treatment. Her beta blockers were suspended. Patient was consulted by cardiologist. An echocardiogram was performed. Echocardiogram revealed ejection fraction of 40-45%, there was mild concentric hypertrophy and moderate tricuspid regurg as well as pleural effusions. Patient's labs revealed a mild elevation of troponin to 0.07. TSH was elevated at 5.5 with low free T4 level signifying hypothyroidism. Patient's MRSA PCR was positive. Patient was  treated conservatively. She was evaluated by physical therapist and recommended rehabilitation placement at skilled nursing facility where she will be discharged today, 31st of December 2016 Discussion by problem 1. Symptomatic bradycardia, etiology likely is hypothyroidism ,   Initiated low-dose of Synthroid , follow TSH level in about 6 weeks. echocardiogram revealed ejection fraction of 40-45% and moderate tricuspid regurgitation , appreciate cardiology consultation.  No further interventions were recommended. Patient's heart rate has improved with conservative therapy alone. No beta blockers were recommended.  2. Dyspnea, likely due to chronic systolic CHF, continue outpatient medications and oxygen therapy, patient may benefit from oxygen therapy at home, she was given Lasix while in the hospital and is to continue Lasix in the facility. She is to continue ACE inhibitor for chronic systolic CHF.  3. Elevated troponin, likely demand ischemia, supportive care only , no further interventions by cardiologist 4. Hypothermia. On arrival, resolved at present, likely due to hypothyroidism, 5. Hypothyroidism. Initiated Synthroid, follow TSH in about 6 weeks and adjust Synthroid dose depending on need 6. Generalized weakness, physical therapist was consulted. Rehabilitation placement today 7. 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 Qty: 118 mL, Refills: 0  chlorhexidine (PERIDEX) 0.12 % solution 15 mLs by Mouth Rinse route  2 (two) times daily. Qty: 120 mL, Refills: 0    feeding supplement, ENSURE ENLIVE, (ENSURE ENLIVE) LIQD Take 237 mLs by mouth 2 (two) times daily with a meal. Qty: 237 mL, Refills: 12    levothyroxine (SYNTHROID, LEVOTHROID) 25 MCG tablet Take 1 tablet (25 mcg total) by mouth daily before breakfast. Qty: 30 tablet, Refills: 5    mupirocin ointment (BACTROBAN) 2 % Place into the nose 2 (two) times daily. Qty: 22 g, Refills: 0      CONTINUE these medications which have CHANGED   Details  aspirin EC 81 MG tablet Take 1 tablet (81 mg total) by mouth daily. Qty: 30 tablet, Refills: 0      CONTINUE these medications which have NOT CHANGED   Details  cetirizine (ZYRTEC) 10 MG tablet Take 10 mg by mouth daily as needed for allergies.     furosemide (LASIX) 20 MG tablet Take 1 tablet (20 mg total) by mouth daily. Qty: 30 tablet, Refills: 0    omeprazole (PRILOSEC) 20 MG capsule Take 20 mg by mouth daily.     potassium chloride (K-DUR,KLOR-CON) 10 MEQ tablet Take 10 mEq by mouth daily.     quinapril (ACCUPRIL) 20 MG tablet Take 20 mg by mouth daily.     simvastatin (ZOCOR) 40 MG tablet Take 40 mg by mouth daily.          DISCHARGE INSTRUCTIONS:    Patient is to follow-up with primary care physician as outpatient  If you experience worsening of your admission symptoms, develop shortness of breath, life threatening emergency, suicidal or homicidal thoughts you must seek medical attention immediately by calling 911 or calling your MD immediately  if symptoms less severe.  You Must read complete instructions/literature along with all the possible adverse reactions/side effects for all the Medicines you take and that have been prescribed to you. Take any new Medicines after you have completely understood and accept all the possible adverse reactions/side effects.   Please note  You were cared for by a hospitalist during your hospital stay. If you have any questions about your  discharge medications or the care you received while you were in the hospital after you are discharged, you can call the unit and asked to speak with the hospitalist on call if the hospitalist that took care of you is not available. Once you are discharged, your primary care physician will handle any further medical issues. Please note that NO REFILLS for any discharge medications will be authorized once you are discharged, as it is imperative that you return to your primary care physician (or establish a relationship with a primary care physician if you do not have one) for your aftercare needs so that they can reassess your need for medications and monitor your lab values.    Today   CHIEF COMPLAINT:   Chief Complaint  Patient presents with  . Fall    HISTORY OF PRESENT ILLNESS:  Avonne Zalar  is a 81 y.o. female with a known history of  essential hypertension, gastroesophageal reflux disease, congestive heart failure, coronary artery disease who presents to the hospital with complaints of weakness and fall. On arrival to emergency room, she was noted to be hypothermic and bradycardic. She was given 1 dose of atropine for heart rate of 45 and her heart rate responded. Patient was admitted to the hospital for further evaluation and treatment. Her beta blockers were suspended. Patient  was consulted by cardiologist. An echocardiogram was performed. Echocardiogram revealed ejection fraction of 40-45%, there was mild concentric hypertrophy and moderate tricuspid regurg as well as pleural effusions. Patient's labs revealed a mild elevation of troponin to 0.07. TSH was elevated at 5.5 with low free T4 level signifying hypothyroidism. Patient's MRSA PCR was positive. Patient was treated conservatively. She was evaluated by physical therapist and recommended rehabilitation placement at skilled nursing facility where she will be discharged today, 31st of December 2016 Discussion by problem 1. Symptomatic  bradycardia, etiology likely is hypothyroidism ,   Initiated low-dose of Synthroid , follow TSH level in about 6 weeks. echocardiogram revealed ejection fraction of 40-45% and moderate tricuspid regurgitation , appreciate cardiology consultation.  No further interventions were recommended. Patient's heart rate has improved with conservative therapy alone. No beta blockers were recommended.  2. Dyspnea, likely due to chronic systolic CHF, continue outpatient medications and oxygen therapy, patient may benefit from oxygen therapy at home, she was given Lasix while in the hospital and is to continue Lasix in the facility. She is to continue ACE inhibitor for chronic systolic CHF.  3. Elevated troponin, likely demand ischemia, supportive care only , no further interventions by cardiologist 4. Hypothermia. On arrival, resolved at present, likely due to hypothyroidism, 5. Hypothyroidism. Initiated Synthroid, follow TSH in about 6 weeks and adjust Synthroid dose depending on need 6. Generalized weakness, physical therapist was consulted. Rehabilitation placement today 7. 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 rate 18, height 5' (1.524 m), weight 52.844 kg (116 lb 8 oz), SpO2 92 %.  I/O:   Intake/Output Summary (Last 24 hours) at 08/23/15 1037 Last data filed at 08/23/15 0900  Gross per 24 hour  Intake    120 ml  Output    350 ml  Net   -230 ml    PHYSICAL EXAMINATION:  GENERAL:  81 y.o.-year-old patient lying in the bed with no acute distress.  EYES: Pupils equal, round, reactive to light and accommodation. No scleral icterus. Extraocular muscles intact.  HEENT: Head atraumatic, normocephalic. Oropharynx and nasopharynx clear.  NECK:  Supple, no jugular venous distention. No thyroid enlargement, no tenderness.  LUNGS: Normal  breath sounds bilaterally, no wheezing, rales,rhonchi or crepitation. No use of accessory muscles of respiration.  CARDIOVASCULAR: S1, S2 normal. No murmurs, rubs, or gallops.  ABDOMEN: Soft, non-tender, non-distended. Bowel sounds present. No organomegaly or mass.  EXTREMITIES: No pedal edema, cyanosis, or clubbing.  NEUROLOGIC: Cranial nerves II through XII are intact. Muscle strength 5/5 in all extremities. Sensation intact. Gait not checked.  PSYCHIATRIC: The patient is alert and oriented x 3.  SKIN: No obvious rash, lesion, or ulcer.   DATA REVIEW:   CBC  Recent Labs Lab 08/21/15 0423  WBC 9.0  HGB 9.3*  HCT 30.4*  PLT 250    Chemistries   Recent Labs Lab 08/20/15 1423 08/21/15 0423 08/23/15 0331  NA 140 140  --   K 3.9 3.7  --   CL 107 107  --   CO2 28 26  --   GLUCOSE 113* 116*  --   BUN 35* 29*  --   CREATININE 0.90 0.83 0.64  CALCIUM 9.1 8.5*  --   AST 21  --   --   ALT 18  --   --   ALKPHOS 91  --   --  BILITOT 0.5  --   --     Cardiac Enzymes  Recent Labs Lab 08/21/15 0009  TROPONINI 0.07*    Microbiology Results  Results for orders placed or performed during the hospital encounter of 08/20/15  Urine culture     Status: None   Collection Time: 08/20/15  3:07 PM  Result Value Ref Range Status   Specimen Description URINE, CATHETERIZED  Final   Special Requests Normal  Final   Culture >=100,000 COLONIES/mL STAPHYLOCOCCUS AUREUS  Final   Report Status 08/23/2015 FINAL  Final   Organism ID, Bacteria STAPHYLOCOCCUS AUREUS  Final      Susceptibility   Staphylococcus aureus - MIC*    CIPROFLOXACIN <=0.5 SENSITIVE Sensitive     ERYTHROMYCIN <=0.25 SENSITIVE Sensitive     GENTAMICIN <=0.5 SENSITIVE Sensitive     OXACILLIN <=0.25 SENSITIVE Sensitive     TRIMETH/SULFA <=10 SENSITIVE Sensitive     CLINDAMYCIN <=0.25 SENSITIVE Sensitive     CEFOXITIN SCREEN NEGATIVE Sensitive     Inducible Clindamycin NEGATIVE Sensitive     NITROFURANTOIN Value in  next row Sensitive      SENSITIVE<=16    TETRACYCLINE Value in next row Resistant      RESISTANT>=16    LEVOFLOXACIN Value in next row Sensitive      SENSITIVE<=0.25    LINEZOLID Value in next row Sensitive      SENSITIVE2    * >=100,000 COLONIES/mL STAPHYLOCOCCUS AUREUS  Blood culture (routine x 2)     Status: None (Preliminary result)   Collection Time: 08/20/15  3:10 PM  Result Value Ref Range Status   Specimen Description BLOOD RIGHT ASSIST CONTROL  Final   Special Requests BOTTLES DRAWN AEROBIC AND ANAEROBIC 2CCAERO,1CCANA  Final   Culture NO GROWTH < 24 HOURS  Final   Report Status PENDING  Incomplete  Blood culture (routine x 2)     Status: None (Preliminary result)   Collection Time: 08/20/15  3:10 PM  Result Value Ref Range Status   Specimen Description BLOOD LEFT ASSIST CONTROL  Final   Special Requests BOTTLES DRAWN AEROBIC AND ANAEROBIC 3CCAERO,3CCANA  Final   Culture  Setup Time   Final    GRAM POSITIVE COCCI ANAEROBIC BOTTLE ONLY CONFIRMED BY SDR/TFK CRITICAL RESULT CALLED TO, READ BACK BY AND VERIFIED WITH: MATT MACBANE, PHARMACIST ON 08/21/15 AT 1134PM BY TLB    Culture   Final    COAGULASE NEGATIVE STAPHYLOCOCCUS ANAEROBIC BOTTLE ONLY Results consistent with contamination.    Report Status PENDING  Incomplete  Blood Culture ID Panel (Reflexed)     Status: Abnormal   Collection Time: 08/20/15  3:10 PM  Result Value Ref Range Status   Enterococcus species NOT DETECTED NOT DETECTED Final   Listeria monocytogenes NOT DETECTED NOT DETECTED Final   Staphylococcus species DETECTED (A) NOT DETECTED Final    Comment: CRITICAL RESULT CALLED TO, READ BACK BY AND VERIFIED WITH: MATT MCBANE, PHARMACIST ON 08/21/15 AT 1134PM BY TLB    Staphylococcus aureus NOT DETECTED NOT DETECTED Final   Streptococcus species NOT DETECTED NOT DETECTED Final   Streptococcus agalactiae NOT DETECTED NOT DETECTED Final   Streptococcus pneumoniae NOT DETECTED NOT DETECTED Final    Streptococcus pyogenes NOT DETECTED NOT DETECTED Final   Acinetobacter baumannii NOT DETECTED NOT DETECTED Final   Enterobacteriaceae species NOT DETECTED NOT DETECTED Final   Enterobacter cloacae complex NOT DETECTED NOT DETECTED Final   Escherichia coli NOT DETECTED NOT DETECTED Final   Klebsiella oxytoca NOT DETECTED  NOT DETECTED Final   Klebsiella pneumoniae NOT DETECTED NOT DETECTED Final   Proteus species NOT DETECTED NOT DETECTED Final   Serratia marcescens NOT DETECTED NOT DETECTED Final   Haemophilus influenzae NOT DETECTED NOT DETECTED Final   Neisseria meningitidis NOT DETECTED NOT DETECTED Final   Pseudomonas aeruginosa NOT DETECTED NOT DETECTED Final   Candida albicans NOT DETECTED NOT DETECTED Final   Candida glabrata NOT DETECTED NOT DETECTED Final   Candida krusei NOT DETECTED NOT DETECTED Final   Candida parapsilosis NOT DETECTED NOT DETECTED Final   Candida tropicalis NOT DETECTED NOT DETECTED Final   Carbapenem resistance NOT DETECTED NOT DETECTED Final   Methicillin resistance DETECTED (A) NOT DETECTED Final    Comment: CRITICAL RESULT CALLED TO, READ BACK BY AND VERIFIED WITH: MATT MCBANE, PHARMACIST ON 08/21/15 AT 1134PM BY TLB    Vancomycin resistance NOT DETECTED NOT DETECTED Final  MRSA PCR Screening     Status: Abnormal   Collection Time: 08/20/15 10:16 PM  Result Value Ref Range Status   MRSA by PCR POSITIVE (A) NEGATIVE Final    Comment: CRITICAL RESULT CALLED TO, READ BACK BY AND VERIFIED WITH: RENEE BABB ON 08/20/15 AT 2335 PM BY TLB        The GeneXpert MRSA Assay (FDA approved for NASAL specimens only), is one component of a comprehensive MRSA colonization surveillance program. It is not intended to diagnose MRSA infection nor to guide or monitor treatment for MRSA infections.     RADIOLOGY:  No results found.  EKG:   Orders placed or performed during the hospital encounter of 08/20/15  . EKG 12-Lead  . EKG 12-Lead  . EKG 12-Lead  .  EKG 12-Lead      Management plans discussed with the patient, family and they are in agreement.  CODE STATUS:     Code Status Orders        Start     Ordered   08/20/15 1718  Full code   Continuous     08/20/15 1718      TOTAL TIME TAKING CARE OF THIS PATIENT: 40 minutes.    Katharina Caper M.D on 08/23/2015 at 10:37 AM  Between 7am to 6pm - Pager - [phone]  After 6pm go to [email or web address] - password [email or web address] [facility]  [facility]  Office  [phone]  CC: Primary care physician; Derwood Kaplan, MD

## 2015-08-23 NOTE — Progress Notes (Signed)
Pt's HR 119, irregular, PVCs noted, pt stable, asleep. Paged Dr. Seth BakeV. Informed of HR changes. Per Dr. Seth BakeV notify SNF when report is given. No further action needed.

## 2015-08-23 NOTE — Progress Notes (Signed)
Dr. Seth BakeV called back. Ok to give Dulcolax Susp. Discharge to SNF pending.

## 2015-08-23 NOTE — Progress Notes (Signed)
Clinical Social Worker informed by Katharina Caperima Vaickute,  MD that patient is medically ready to discharge to SNF for STR, Patient and  Rhys MartiniFriend Melinda are in a agreement with plan.  Call to [facility] to confirm that patient's bed is ready. Provided patient's room number 109 and number to call for report [facility] nurse at [phone] . All discharge information faxed to  Facility.   RN will call report and patient will discharge to [facility] via EMS.  Sammuel Hineseborah Moore. LCSWA Clinical Social Work Department [phone] 11:32 AM

## 2015-08-23 NOTE — Progress Notes (Signed)
Paged MD for BM medication

## 2015-08-23 NOTE — Clinical Social Work Placement (Signed)
   CLINICAL SOCIAL WORK PLACEMENT  NOTE  Date:  08/23/2015  Patient Details  Name: [name] [name] MRN: [id number] Date of Birth: [birth year]  Clinical Social Work is seeking post-discharge placement for this patient at the Skilled  Nursing Facility level of care (*CSW will initial, date and re-position this form in  chart as items are completed):  Yes   Patient/family provided with [facility] Clinical Social Work Department's list of facilities offering this level of care within the geographic area requested by the patient (or if unable, by the patient's family).  Yes   Patient/family informed of their freedom to choose among providers that offer the needed level of care, that participate in Medicare, Medicaid or managed care program needed by the patient, have an available bed and are willing to accept the patient.  Yes   Patient/family informed of [facility] ownership interest [facility] and [facility], as well as of the fact that they are under no obligation to receive care at these facilities.  PASRR submitted to EDS on 08/22/15     PASRR number received on 08/22/15     Existing PASRR number confirmed on       FL2 transmitted to all facilities in geographic area requested by pt/family on 08/22/15     FL2 transmitted to all facilities within larger geographic area on       Patient informed that his/her managed care company has contracts with or will negotiate with certain facilities, including the following:        Yes   Patient/family informed of bed offers received.  Patient chooses bed at  [facility]([employer] )     Physician recommends and patient chooses bed at      Patient to be transferred to  [facility]([employer] ) on 08/23/15.  Patient to be transferred to facility by  (EMS)     Patient family notified on 08/23/15 of transfer.  Name of family member notified:   (family friend William HamburgerMelinda Menz)     PHYSICIAN       Additional Comment:     _______________________________________________ Soundra PilonMoore, Deborah H, LCSW 08/23/2015, 11:30 AM

## 2015-08-25 LAB — CULTURE, BLOOD (ROUTINE X 2)

## 2015-08-26 ENCOUNTER — Ambulatory Visit: Payer: Medicare Other | Attending: Family | Admitting: Family

## 2015-08-26 ENCOUNTER — Encounter: Payer: Self-pay | Admitting: Family

## 2015-08-26 VITALS — BP 124/61 | HR 83 | Resp 18

## 2015-08-26 DIAGNOSIS — I2510 Atherosclerotic heart disease of native coronary artery without angina pectoris: Secondary | ICD-10-CM | POA: Diagnosis not present

## 2015-08-26 DIAGNOSIS — Z7982 Long term (current) use of aspirin: Secondary | ICD-10-CM | POA: Insufficient documentation

## 2015-08-26 DIAGNOSIS — I110 Hypertensive heart disease with heart failure: Secondary | ICD-10-CM | POA: Diagnosis not present

## 2015-08-26 DIAGNOSIS — R001 Bradycardia, unspecified: Secondary | ICD-10-CM | POA: Diagnosis not present

## 2015-08-26 DIAGNOSIS — Z87891 Personal history of nicotine dependence: Secondary | ICD-10-CM | POA: Insufficient documentation

## 2015-08-26 DIAGNOSIS — I10 Essential (primary) hypertension: Secondary | ICD-10-CM

## 2015-08-26 DIAGNOSIS — Z79899 Other long term (current) drug therapy: Secondary | ICD-10-CM | POA: Insufficient documentation

## 2015-08-26 DIAGNOSIS — Z951 Presence of aortocoronary bypass graft: Secondary | ICD-10-CM | POA: Insufficient documentation

## 2015-08-26 DIAGNOSIS — Z9889 Other specified postprocedural states: Secondary | ICD-10-CM | POA: Diagnosis not present

## 2015-08-26 DIAGNOSIS — I5022 Chronic systolic (congestive) heart failure: Secondary | ICD-10-CM

## 2015-08-26 DIAGNOSIS — K219 Gastro-esophageal reflux disease without esophagitis: Secondary | ICD-10-CM | POA: Diagnosis not present

## 2015-08-26 LAB — CULTURE, BLOOD (ROUTINE X 2): CULTURE: NO GROWTH

## 2015-08-26 NOTE — Progress Notes (Signed)
Subjective:    Patient ID: [name] [name], female    DOB: [birth year], 82 y.o.   MRN: [id number]  Congestive Heart Failure Presents for follow-up visit. The disease course has been improving. Associated symptoms include fatigue, muscle weakness and shortness of breath. Pertinent negatives include no abdominal pain, chest pain, chest pressure, edema, orthopnea or palpitations. The symptoms have been improving. Past treatments include ACE inhibitors, salt and fluid restriction and oxygen. The treatment provided moderate relief. Compliance with prior treatments has been good. Her past medical history is significant for CAD and HTN. She has one 1st degree relative with heart disease. Compliance with total regimen is 76-100%.  Hypertension This is a chronic problem. The current episode started more than 1 year ago. The problem is unchanged. The problem is controlled. Associated symptoms include neck pain and shortness of breath. Pertinent negatives include no chest pain, headaches, palpitations or peripheral edema. There are no associated agents to hypertension. Risk factors for coronary artery disease include post-menopausal state and sedentary lifestyle. Past treatments include ACE inhibitors and diuretics. The current treatment provides moderate improvement. There are no compliance problems.  Hypertensive end-organ damage includes heart failure.    Past Medical History  Diagnosis Date  . Hypertension   . GERD (gastroesophageal reflux disease)   . CHF (congestive heart failure) (HCC)   . CAD (coronary artery disease)   . Cellulitis 03/2015    Past Surgical History  Procedure Laterality Date  . Rib fracture surgery    . Coronary artery bypass graft      Family History  Problem Relation Age of Onset  . Hypertension Mother   . CAD Father     Social History  Substance Use Topics  . Smoking status: Former Games developermoker  . Smokeless tobacco: Never Used  . Alcohol Use: No    No Known  Allergies  Prior to Admission medications   Medication Sig Start Date End Date Taking? Authorizing Provider  antiseptic oral rinse (CPC / CETYLPYRIDINIUM CHLORIDE 0.05%) 0.05 % LIQD solution 7 mLs by Mouth Rinse route 2 times daily at 12 noon and 4 pm. 08/23/15  Yes Katharina Caperima Vaickute, MD  aspirin EC 81 MG tablet Take 1 tablet (81 mg total) by mouth daily. 08/23/15  Yes Katharina Caperima Vaickute, MD  cetirizine (ZYRTEC) 10 MG tablet Take 10 mg by mouth daily as needed for allergies.    Yes Historical Provider, MD  chlorhexidine (PERIDEX) 0.12 % solution 15 mLs by Mouth Rinse route 2 (two) times daily. 08/23/15  Yes Katharina Caperima Vaickute, MD  feeding supplement, ENSURE ENLIVE, (ENSURE ENLIVE) LIQD Take 237 mLs by mouth 2 (two) times daily with a meal. 08/23/15  Yes Katharina Caperima Vaickute, MD  furosemide (LASIX) 20 MG tablet Take 1 tablet (20 mg total) by mouth daily. 04/17/15  Yes Alford Highlandichard Wieting, MD  levothyroxine (SYNTHROID, LEVOTHROID) 25 MCG tablet Take 1 tablet (25 mcg total) by mouth daily before breakfast. 08/23/15  Yes Katharina Caperima Vaickute, MD  mupirocin ointment (BACTROBAN) 2 % Place into the nose 2 (two) times daily. 08/23/15  Yes Katharina Caperima Vaickute, MD  omeprazole (PRILOSEC) 20 MG capsule Take 20 mg by mouth daily.    Yes Historical Provider, MD  potassium chloride (K-DUR,KLOR-CON) 10 MEQ tablet Take 10 mEq by mouth daily.    Yes Historical Provider, MD  quinapril (ACCUPRIL) 20 MG tablet Take 20 mg by mouth daily.    Yes Historical Provider, MD  simvastatin (ZOCOR) 40 MG tablet Take 40 mg by mouth daily.    Yes  Historical Provider, MD     Review of Systems  Constitutional: Positive for fatigue. Negative for appetite change.  HENT: Negative for congestion, postnasal drip and sore throat.   Eyes: Negative.   Respiratory: Positive for cough (little bit) and shortness of breath. Negative for chest tightness.   Cardiovascular: Negative for chest pain, palpitations and leg swelling.  Gastrointestinal: Negative for abdominal pain and  abdominal distention.  Endocrine: Negative.   Genitourinary: Negative.   Musculoskeletal: Positive for back pain, arthralgias (both heels), muscle weakness and neck pain.  Skin: Negative.   Allergic/Immunologic: Negative.   Neurological: Positive for light-headedness. Negative for dizziness and headaches.  Hematological: Negative for adenopathy. Bruises/bleeds easily.  Psychiatric/Behavioral: Negative for sleep disturbance and dysphoric mood. The patient is not nervous/anxious.        Objective:   Physical Exam  Constitutional: She is oriented to person, place, and time. She appears well-developed and well-nourished.  HENT:  Head: Normocephalic and atraumatic.  Eyes: Conjunctivae are normal. Pupils are equal, round, and reactive to light.  Neck: Normal range of motion. Neck supple.  Cardiovascular: Normal rate and regular rhythm.   Pulmonary/Chest: Effort normal. She has no wheezes. She has no rales.  Abdominal: Soft. She exhibits no distension. There is no tenderness.  Musculoskeletal: She exhibits no edema or tenderness.  Neurological: She is alert and oriented to person, place, and time.  Skin: Skin is warm and dry.  Psychiatric: She has a normal mood and affect. Her behavior is normal. Thought content normal.  Nursing note and vitals reviewed.   BP 124/61 mmHg  Pulse 83  Resp 18  SpO2 100%       Assessment & Plan:  1: Chronic heart failure with reduced ejection fraction- Patient presents in a wheelchair with fatigue and shortness of breath with little exertion. She has recently been started on oxygen which she wears at 2L around the clock which she says has helped quite a bit. Prior to the oxygen, she would get very short of breath trying to eat or to converse. She gets [facility] but is unsure of her weight. Did not weigh her in the office because of her generalized weakness. She has recently begun physical therapy with her goal of being able to return  to her home to take care of her son. She does not add salt to her food and doesn't feel like the food served is salty. No edema at this time. Has received her flu vaccine for this season.  2: HTN- Blood pressure looks good. Continue medications at this time. 3: Bradycardia- She was recently in the hospital after a fall and bradycardia. No further falls and heart rate is now in the 80's. Currently living at [facility].  Return here in 3 months or sooner for any questions/problems before then.

## 2015-08-31 IMAGING — US US EXTREM LOW VENOUS*L*
1 series · 13 of 24 positions shown · non-contrast
Comparison: None.

CLINICAL DATA: Left lower extremity edema, pain and erythema.



[Series 1: us extrem low venous*left* · 0.08mm/px · 13 of 33 slices shown]
[im 1/33]
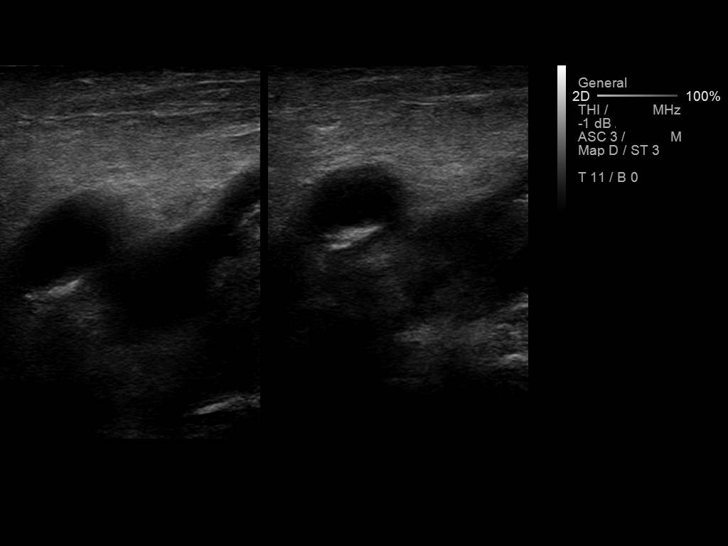
[im 3/33]
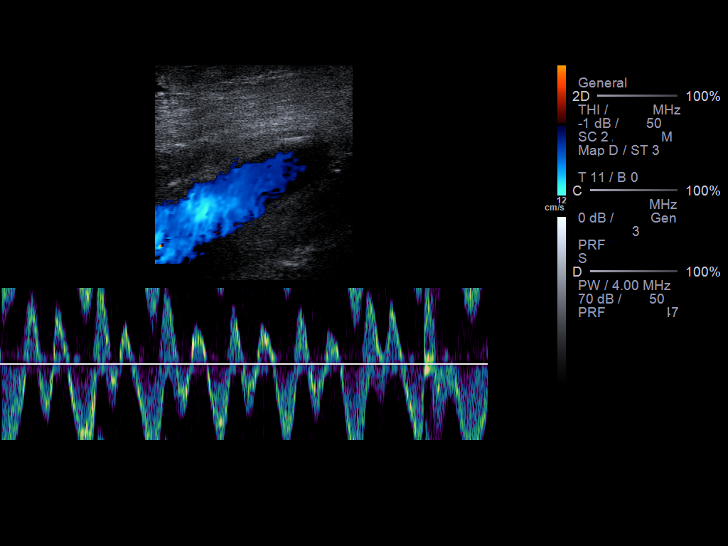
[im 6/33]
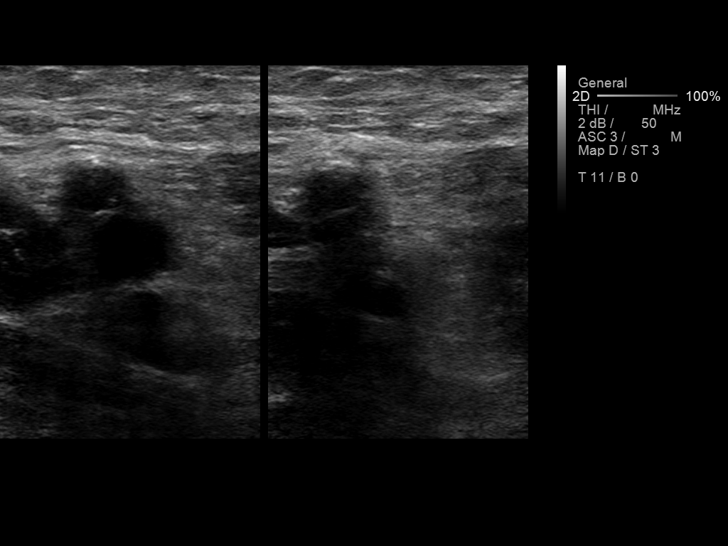
[im 9/33]
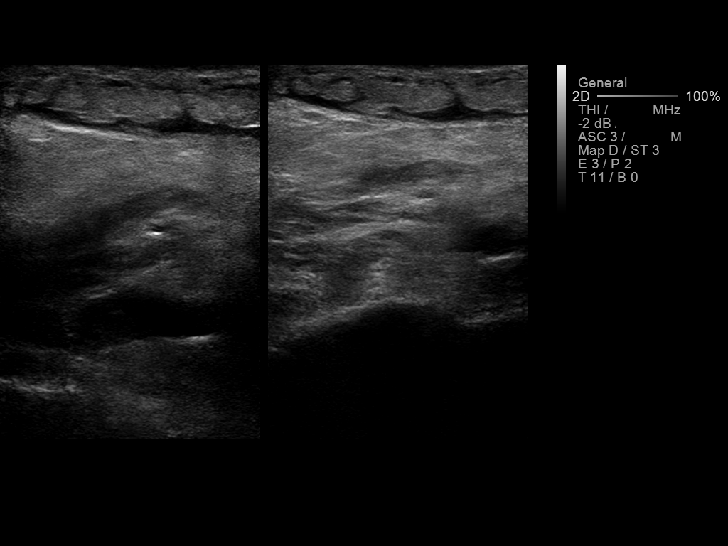
[im 12/33]
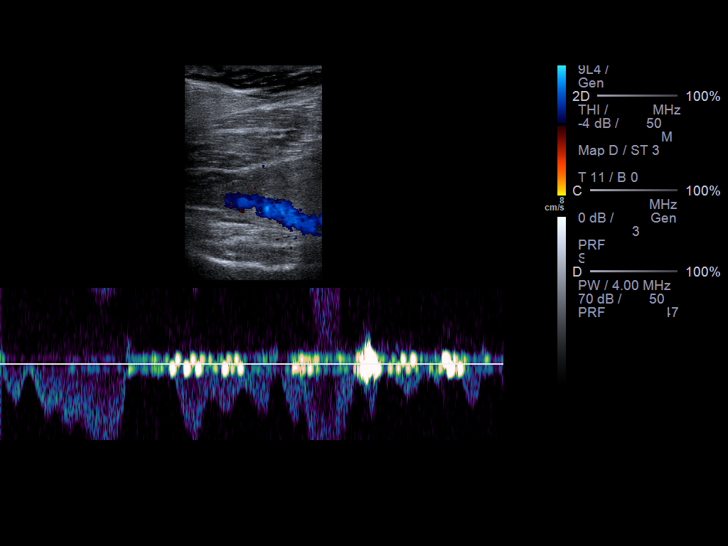
[im 14/33]
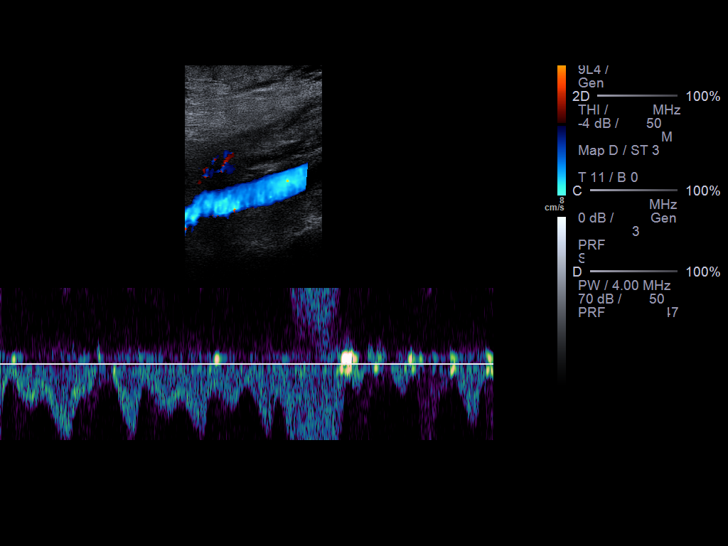
[im 17/33]
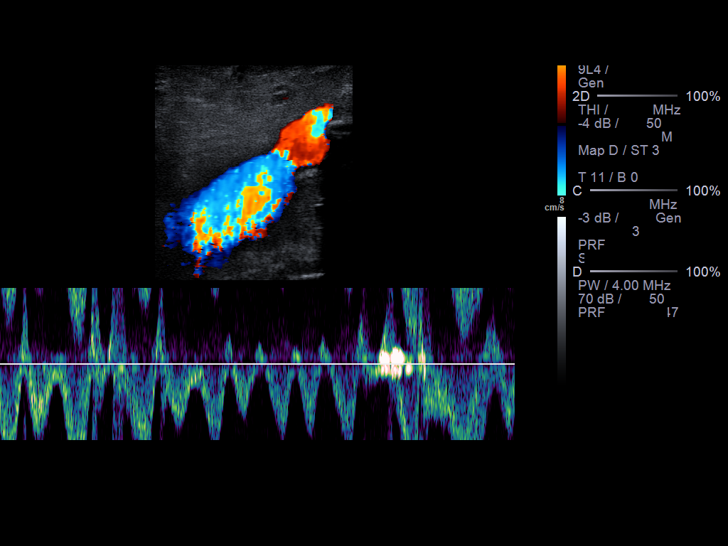
[im 19/33]
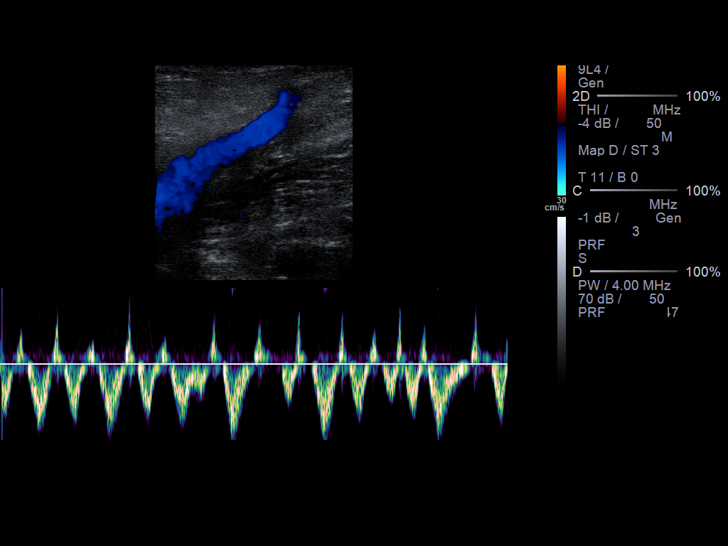
[im 21/33]
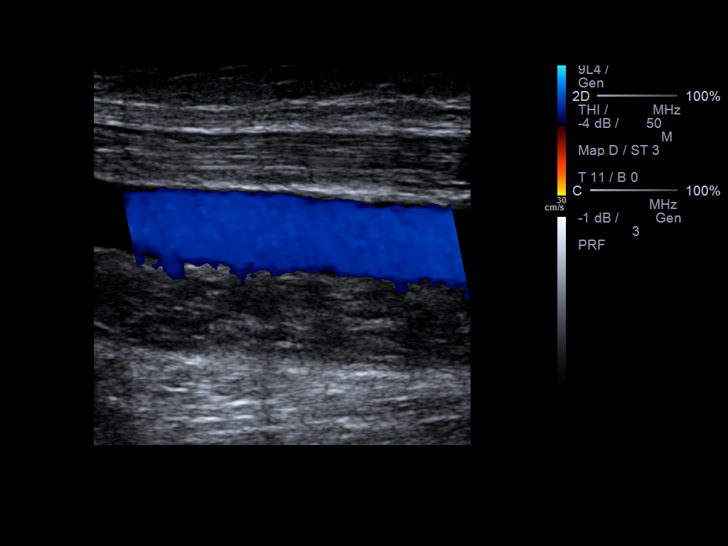
[im 24/33]
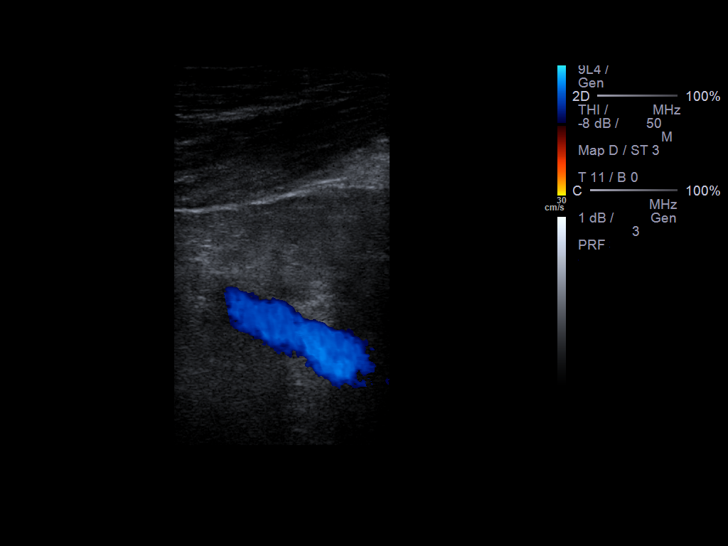
[im 27/33]
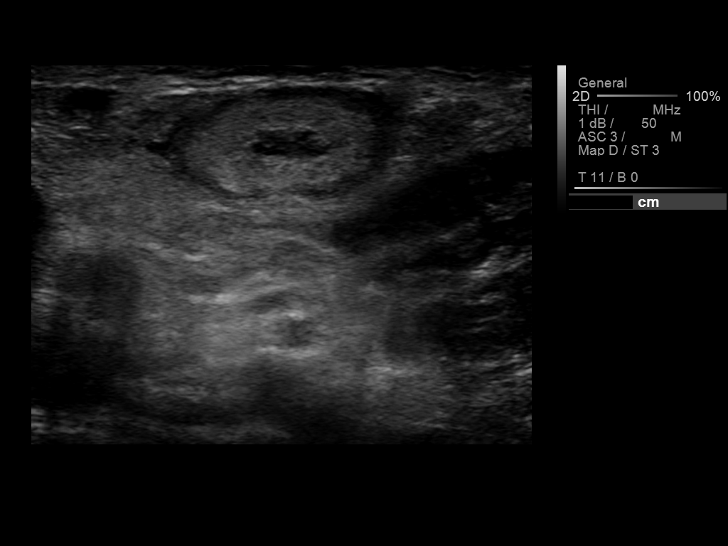
[im 30/33]
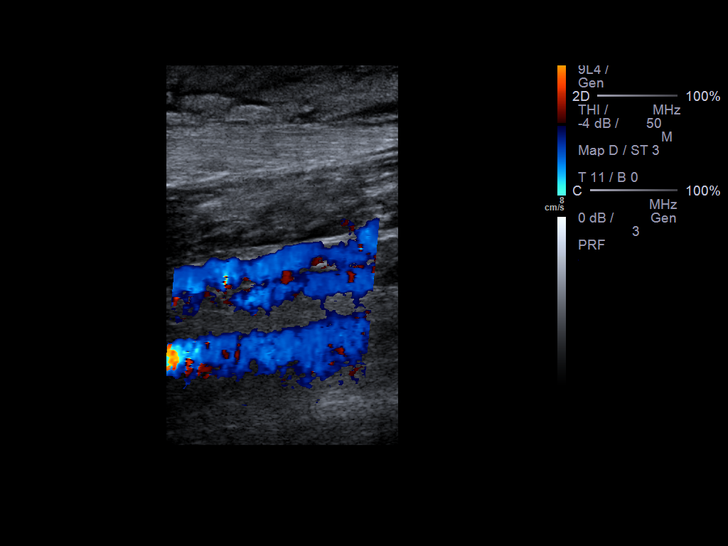
[im 33/33]
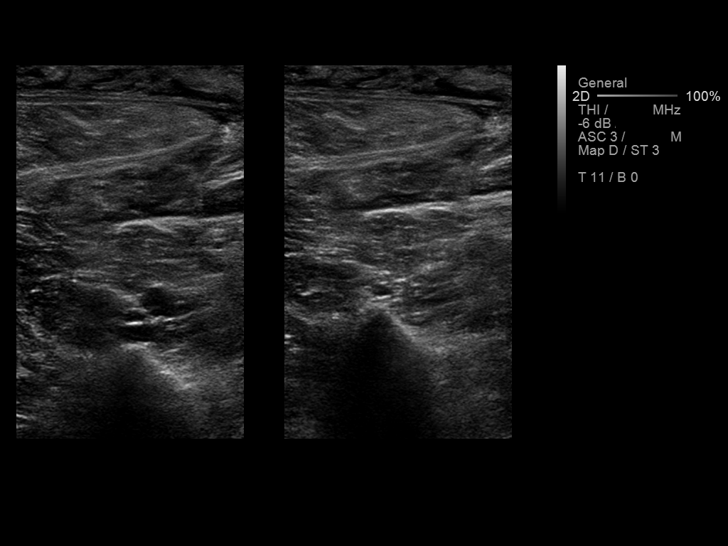

[13 of 24 positions shown; findings below may reference images not displayed]

FINDINGS: Contralateral Common Femoral Vein: Respiratory phasicity is normal
and symmetric with the symptomatic side. No evidence of thrombus.
Normal compressibility.

Common Femoral Vein: No evidence of thrombus. Normal
compressibility, respiratory phasicity and response to augmentation.

Saphenofemoral Junction: No evidence of thrombus. Normal
compressibility and flow on color Doppler imaging.

Profunda Femoral Vein: No evidence of thrombus. Normal
compressibility and flow on color Doppler imaging.

Femoral Vein: No evidence of thrombus. Normal compressibility,
respiratory phasicity and response to augmentation.

Popliteal Vein: No evidence of thrombus. Normal compressibility,
respiratory phasicity and response to augmentation.

Calf Veins: No evidence of thrombus. Normal compressibility and flow
on color Doppler imaging.

Superficial Great Saphenous Vein: No evidence of thrombus. Normal
compressibility and flow on color Doppler imaging.

Venous Reflux:  None.

Other Findings: No evidence of superficial thrombophlebitis or
abnormal fluid collection. Mildly prominent left inguinal lymph node
with normal short axis dimensions of 0.8 cm.
IMPRESSION: No evidence of left lower extremity deep venous thrombosis.

## 2015-10-17 ENCOUNTER — Inpatient Hospital Stay
Admission: EM | Admit: 2015-10-17 | Discharge: 2015-10-24 | DRG: 871 | Disposition: A | Discharge status: Skilled Nursing Facility | Payer: Medicare Other | Attending: Specialist | Admitting: Specialist

## 2015-10-17 ENCOUNTER — Encounter: Payer: Self-pay | Admitting: Emergency Medicine

## 2015-10-17 ENCOUNTER — Emergency Department: Payer: Medicare Other

## 2015-10-17 ENCOUNTER — Inpatient Hospital Stay
Admit: 2015-10-17 | Discharge: 2015-10-17 | Disposition: A | Discharge status: Home / Self Care | Payer: Medicare Other | Attending: Specialist | Admitting: Specialist

## 2015-10-17 DIAGNOSIS — L8990 Pressure ulcer of unspecified site, unspecified stage: Secondary | ICD-10-CM | POA: Insufficient documentation

## 2015-10-17 DIAGNOSIS — I5023 Acute on chronic systolic (congestive) heart failure: Secondary | ICD-10-CM | POA: Diagnosis present

## 2015-10-17 DIAGNOSIS — E785 Hyperlipidemia, unspecified: Secondary | ICD-10-CM | POA: Diagnosis present

## 2015-10-17 DIAGNOSIS — J690 Pneumonitis due to inhalation of food and vomit: Secondary | ICD-10-CM | POA: Diagnosis present

## 2015-10-17 DIAGNOSIS — I447 Left bundle-branch block, unspecified: Secondary | ICD-10-CM | POA: Diagnosis present

## 2015-10-17 DIAGNOSIS — K449 Diaphragmatic hernia without obstruction or gangrene: Secondary | ICD-10-CM | POA: Diagnosis present

## 2015-10-17 DIAGNOSIS — Z87891 Personal history of nicotine dependence: Secondary | ICD-10-CM

## 2015-10-17 DIAGNOSIS — E039 Hypothyroidism, unspecified: Secondary | ICD-10-CM | POA: Diagnosis present

## 2015-10-17 DIAGNOSIS — Z66 Do not resuscitate: Secondary | ICD-10-CM | POA: Diagnosis present

## 2015-10-17 DIAGNOSIS — N189 Chronic kidney disease, unspecified: Secondary | ICD-10-CM | POA: Diagnosis present

## 2015-10-17 DIAGNOSIS — Z7982 Long term (current) use of aspirin: Secondary | ICD-10-CM

## 2015-10-17 DIAGNOSIS — Z79899 Other long term (current) drug therapy: Secondary | ICD-10-CM

## 2015-10-17 DIAGNOSIS — N183 Chronic kidney disease, stage 3 (moderate): Secondary | ICD-10-CM | POA: Diagnosis present

## 2015-10-17 DIAGNOSIS — I214 Non-ST elevation (NSTEMI) myocardial infarction: Secondary | ICD-10-CM | POA: Diagnosis present

## 2015-10-17 DIAGNOSIS — I2510 Atherosclerotic heart disease of native coronary artery without angina pectoris: Secondary | ICD-10-CM | POA: Diagnosis present

## 2015-10-17 DIAGNOSIS — G9340 Encephalopathy, unspecified: Secondary | ICD-10-CM | POA: Diagnosis present

## 2015-10-17 DIAGNOSIS — R6521 Severe sepsis with septic shock: Secondary | ICD-10-CM | POA: Diagnosis present

## 2015-10-17 DIAGNOSIS — E861 Hypovolemia: Secondary | ICD-10-CM | POA: Diagnosis present

## 2015-10-17 DIAGNOSIS — D649 Anemia, unspecified: Secondary | ICD-10-CM | POA: Diagnosis present

## 2015-10-17 DIAGNOSIS — N170 Acute kidney failure with tubular necrosis: Secondary | ICD-10-CM | POA: Diagnosis present

## 2015-10-17 DIAGNOSIS — J9601 Acute respiratory failure with hypoxia: Secondary | ICD-10-CM | POA: Diagnosis present

## 2015-10-17 DIAGNOSIS — J9600 Acute respiratory failure, unspecified whether with hypoxia or hypercapnia: Secondary | ICD-10-CM

## 2015-10-17 DIAGNOSIS — I429 Cardiomyopathy, unspecified: Secondary | ICD-10-CM | POA: Diagnosis present

## 2015-10-17 DIAGNOSIS — Z951 Presence of aortocoronary bypass graft: Secondary | ICD-10-CM

## 2015-10-17 DIAGNOSIS — K219 Gastro-esophageal reflux disease without esophagitis: Secondary | ICD-10-CM | POA: Diagnosis present

## 2015-10-17 DIAGNOSIS — A419 Sepsis, unspecified organism: Secondary | ICD-10-CM | POA: Diagnosis present

## 2015-10-17 DIAGNOSIS — R1310 Dysphagia, unspecified: Secondary | ICD-10-CM | POA: Diagnosis present

## 2015-10-17 DIAGNOSIS — J9621 Acute and chronic respiratory failure with hypoxia: Secondary | ICD-10-CM | POA: Diagnosis not present

## 2015-10-17 DIAGNOSIS — I420 Dilated cardiomyopathy: Secondary | ICD-10-CM | POA: Diagnosis not present

## 2015-10-17 DIAGNOSIS — J9690 Respiratory failure, unspecified, unspecified whether with hypoxia or hypercapnia: Secondary | ICD-10-CM

## 2015-10-17 DIAGNOSIS — J189 Pneumonia, unspecified organism: Secondary | ICD-10-CM

## 2015-10-17 DIAGNOSIS — I130 Hypertensive heart and chronic kidney disease with heart failure and stage 1 through stage 4 chronic kidney disease, or unspecified chronic kidney disease: Secondary | ICD-10-CM | POA: Diagnosis present

## 2015-10-17 LAB — COMPREHENSIVE METABOLIC PANEL
ALK PHOS: 121 U/L (ref 38–126)
ALT: 31 U/L (ref 14–54)
ANION GAP: 12 (ref 5–15)
AST: 72 U/L — ABNORMAL HIGH (ref 15–41)
Albumin: 3.5 g/dL (ref 3.5–5.0)
BILIRUBIN TOTAL: 0.4 mg/dL (ref 0.3–1.2)
BUN: 34 mg/dL — ABNORMAL HIGH (ref 6–20)
CALCIUM: 8.9 mg/dL (ref 8.9–10.3)
CO2: 23 mmol/L (ref 22–32)
Chloride: 98 mmol/L — ABNORMAL LOW (ref 101–111)
Creatinine, Ser: 1.3 mg/dL — ABNORMAL HIGH (ref 0.44–1.00)
GFR calc non Af Amer: 34 mL/min — ABNORMAL LOW (ref >60)
GFR, EST AFRICAN AMERICAN: 40 mL/min — AB (ref >60)
Glucose, Bld: 115 mg/dL — ABNORMAL HIGH (ref 65–99)
POTASSIUM: 4.3 mmol/L (ref 3.5–5.1)
SODIUM: 133 mmol/L — AB (ref 135–145)
TOTAL PROTEIN: 7.2 g/dL (ref 6.5–8.1)

## 2015-10-17 LAB — BLOOD GAS, ARTERIAL
ACID-BASE DEFICIT: 2.9 mmol/L — AB (ref 0.0–2.0)
Allens test (pass/fail): POSITIVE — AB
BICARBONATE: 23.2 meq/L (ref 21.0–28.0)
FIO2: 0.32
O2 Saturation: 93.8 %
PCO2 ART: 45 mmHg (ref 32.0–48.0)
PH ART: 7.32 — AB (ref 7.350–7.450)
PO2 ART: 76 mmHg — AB (ref 83.0–108.0)
Patient temperature: 37

## 2015-10-17 LAB — PROTIME-INR
INR: 1.1
PROTHROMBIN TIME: 14.4 s (ref 11.4–15.0)

## 2015-10-17 LAB — CBC WITH DIFFERENTIAL/PLATELET
BASOS ABS: 0 10*3/uL (ref 0–0.1)
Basophils Relative: 0 %
EOS ABS: 0 10*3/uL (ref 0–0.7)
Eosinophils Relative: 0 %
HCT: 36.6 % (ref 35.0–47.0)
HEMOGLOBIN: 11.2 g/dL — AB (ref 12.0–16.0)
LYMPHS PCT: 3 %
Lymphs Abs: 0.4 10*3/uL — ABNORMAL LOW (ref 1.0–3.6)
MCH: 22.3 pg — ABNORMAL LOW (ref 26.0–34.0)
MCHC: 30.5 g/dL — ABNORMAL LOW (ref 32.0–36.0)
MCV: 73 fL — ABNORMAL LOW (ref 80.0–100.0)
Monocytes Absolute: 0.7 10*3/uL (ref 0.2–0.9)
Monocytes Relative: 5 %
NEUTROS PCT: 92 %
Neutro Abs: 12.3 10*3/uL — ABNORMAL HIGH (ref 1.4–6.5)
Platelets: 248 10*3/uL (ref 150–440)
RBC: 5.01 MIL/uL (ref 3.80–5.20)
RDW: 21.2 % — ABNORMAL HIGH (ref 11.5–14.5)
WBC: 13.4 10*3/uL — ABNORMAL HIGH (ref 3.6–11.0)

## 2015-10-17 LAB — APTT: APTT: 34 s (ref 24–36)

## 2015-10-17 LAB — TROPONIN I
TROPONIN I: 3.08 ng/mL — AB (ref <0.031)
TROPONIN I: 8.23 ng/mL — AB (ref <0.031)
Troponin I: 6.96 ng/mL — ABNORMAL HIGH (ref <0.031)

## 2015-10-17 LAB — RAPID INFLUENZA A&B ANTIGENS
Influenza A (ARMC): NOT DETECTED
Influenza B (ARMC): NOT DETECTED

## 2015-10-17 LAB — MRSA PCR SCREENING: MRSA BY PCR: NEGATIVE

## 2015-10-17 LAB — LACTIC ACID, PLASMA
LACTIC ACID, VENOUS: 3.8 mmol/L — AB (ref 0.5–2.0)
LACTIC ACID, VENOUS: 1.5 mmol/L (ref 0.5–2.0)

## 2015-10-17 MED ORDER — ONDANSETRON HCL 4 MG/2ML IJ SOLN: 4.0000 mg | Freq: Four times a day (QID) | INTRAMUSCULAR | Status: DC | PRN

## 2015-10-17 MED ORDER — SODIUM CHLORIDE 0.9 % IV BOLUS (SEPSIS)
250.0000 mL | Freq: Once | INTRAVENOUS | Status: AC
  Administered 2015-10-17: 250 mL via INTRAVENOUS

## 2015-10-17 MED ORDER — ONDANSETRON HCL 4 MG PO TABS: 4.0000 mg | ORAL_TABLET | Freq: Four times a day (QID) | ORAL | Status: DC | PRN

## 2015-10-17 MED ORDER — CHLORHEXIDINE GLUCONATE 0.12 % MT SOLN
15.0000 mL | Freq: Two times a day (BID) | OROMUCOSAL | Status: DC
  Administered 2015-10-17 – 2015-10-24 (×14): 15 mL via OROMUCOSAL
  Filled 2015-10-17 (×14): qty 15

## 2015-10-17 MED ORDER — ONDANSETRON HCL 4 MG/2ML IJ SOLN
INTRAMUSCULAR | Status: AC
Start: 2015-10-17 — End: 2015-10-17
  Administered 2015-10-17: 4 mg via INTRAVENOUS
  Filled 2015-10-17: qty 2

## 2015-10-17 MED ORDER — NOREPINEPHRINE BITARTRATE 1 MG/ML IV SOLN
2.0000 ug/min | INTRAVENOUS | Status: DC
  Filled 2015-10-17: qty 4

## 2015-10-17 MED ORDER — ENOXAPARIN SODIUM 40 MG/0.4ML ~~LOC~~ SOLN: 40.0000 mg | SUBCUTANEOUS | Status: DC

## 2015-10-17 MED ORDER — VANCOMYCIN HCL IN DEXTROSE 750-5 MG/150ML-% IV SOLN
750.0000 mg | INTRAVENOUS | Status: DC
  Administered 2015-10-19 – 2015-10-21 (×2): 750 mg via INTRAVENOUS
  Filled 2015-10-17 (×2): qty 150

## 2015-10-17 MED ORDER — ACETAMINOPHEN 325 MG PO TABS: 650.0000 mg | ORAL_TABLET | Freq: Four times a day (QID) | ORAL | Status: DC | PRN

## 2015-10-17 MED ORDER — NOREPINEPHRINE BITARTRATE 1 MG/ML IV SOLN
2.0000 ug/min | Freq: Once | INTRAVENOUS | Status: DC
  Filled 2015-10-17: qty 4

## 2015-10-17 MED ORDER — PIPERACILLIN-TAZOBACTAM 3.375 G IVPB 30 MIN
3.3750 g | Freq: Once | INTRAVENOUS | Status: AC
  Administered 2015-10-17: 3.375 g via INTRAVENOUS
  Filled 2015-10-17: qty 50

## 2015-10-17 MED ORDER — NOREPINEPHRINE BITARTRATE 1 MG/ML IV SOLN
2.0000 ug/min | INTRAVENOUS | Status: DC
  Administered 2015-10-17: 3 ug/min via INTRAVENOUS
  Filled 2015-10-17 (×2): qty 4

## 2015-10-17 MED ORDER — PANTOPRAZOLE SODIUM 40 MG PO TBEC: 40.0000 mg | DELAYED_RELEASE_TABLET | Freq: Every day | ORAL | Status: DC

## 2015-10-17 MED ORDER — SODIUM CHLORIDE 0.9 % IV BOLUS (SEPSIS)
1000.0000 mL | Freq: Once | INTRAVENOUS | Status: AC
Start: 2015-10-17 — End: 2015-10-17
  Administered 2015-10-17: 1000 mL via INTRAVENOUS

## 2015-10-17 MED ORDER — ASPIRIN EC 81 MG PO TBEC
81.0000 mg | DELAYED_RELEASE_TABLET | Freq: Every day | ORAL | Status: DC
  Administered 2015-10-19 – 2015-10-24 (×6): 81 mg via ORAL
  Filled 2015-10-17 (×6): qty 1

## 2015-10-17 MED ORDER — POTASSIUM CHLORIDE CRYS ER 10 MEQ PO TBCR
10.0000 meq | EXTENDED_RELEASE_TABLET | Freq: Every day | ORAL | Status: DC
  Administered 2015-10-19 – 2015-10-24 (×6): 10 meq via ORAL
  Filled 2015-10-17 (×6): qty 1

## 2015-10-17 MED ORDER — ACETAMINOPHEN 650 MG RE SUPP: 650.0000 mg | Freq: Four times a day (QID) | RECTAL | Status: DC | PRN

## 2015-10-17 MED ORDER — PIPERACILLIN-TAZOBACTAM 3.375 G IVPB
3.3750 g | Freq: Two times a day (BID) | INTRAVENOUS | Status: DC
  Administered 2015-10-17 (×2): 3.375 g via INTRAVENOUS
  Filled 2015-10-17 (×5): qty 50

## 2015-10-17 MED ORDER — VANCOMYCIN HCL IN DEXTROSE 1-5 GM/200ML-% IV SOLN
1000.0000 mg | Freq: Once | INTRAVENOUS | Status: AC
  Administered 2015-10-17: 1000 mg via INTRAVENOUS
  Filled 2015-10-17: qty 200

## 2015-10-17 MED ORDER — ONDANSETRON HCL 4 MG/2ML IJ SOLN
4.0000 mg | Freq: Once | INTRAMUSCULAR | Status: AC
  Administered 2015-10-17: 4 mg via INTRAVENOUS

## 2015-10-17 MED ORDER — SODIUM CHLORIDE 0.9 % IV SOLN
INTRAVENOUS | Status: DC
  Administered 2015-10-17 – 2015-10-20 (×4): via INTRAVENOUS

## 2015-10-17 MED ORDER — HEPARIN BOLUS VIA INFUSION
2400.0000 [IU] | Freq: Once | INTRAVENOUS | Status: DC
  Filled 2015-10-17: qty 2400

## 2015-10-17 MED ORDER — LEVOTHYROXINE SODIUM 25 MCG PO TABS
25.0000 ug | ORAL_TABLET | Freq: Every day | ORAL | Status: DC
  Administered 2015-10-19 – 2015-10-24 (×6): 25 ug via ORAL
  Filled 2015-10-17 (×6): qty 1

## 2015-10-17 MED ORDER — PANTOPRAZOLE SODIUM 40 MG IV SOLR
20.0000 mg | INTRAVENOUS | Status: DC
  Administered 2015-10-17 – 2015-10-21 (×5): 20 mg via INTRAVENOUS
  Filled 2015-10-17: qty 40
  Filled 2015-10-17: qty 80
  Filled 2015-10-17 (×4): qty 40

## 2015-10-17 MED ORDER — SIMVASTATIN 40 MG PO TABS
40.0000 mg | ORAL_TABLET | Freq: Every day | ORAL | Status: DC
  Administered 2015-10-19 – 2015-10-24 (×6): 40 mg via ORAL
  Filled 2015-10-17 (×6): qty 1

## 2015-10-17 MED ORDER — HEPARIN (PORCINE) IN NACL 100-0.45 UNIT/ML-% IJ SOLN
850.0000 [IU]/h | INTRAMUSCULAR | Status: DC
  Administered 2015-10-17: 600 [IU]/h via INTRAVENOUS
  Administered 2015-10-19 – 2015-10-20 (×2): 850 [IU]/h via INTRAVENOUS
  Filled 2015-10-17 (×7): qty 250

## 2015-10-17 NOTE — Progress Notes (Signed)
Pharmacy Antibiotic Note  [name] [name] is a 82 y.o. female admitted on 10/17/2015 with sepsis.  Pharmacy has been consulted for vancomycin and Zosyn dosing. Patient received vancomycin 1g in ED on 2/24.   Plan: Vancomycin  IV Q48hr for goal trough of 15-20. Based on age, weight and decreased renal function, will not initiate stacked dosing in this patient. Will scheduled next dose for 2/26 at 1100. Will obtain trough as clinically indicated. Will obtain follow serum creatinine with am labs.    Zosyn 3.375g EI IV Q12hr.    Height:  (154.9 cm) Weight: 87 lb 14.4 oz (39.871 kg) IBW/kg (Calculated) : 47.8  Temp (24hrs), Avg:98.4 F (36.9 C), Min:98 F (36.7 C), Max:100.2 F (37.9 C)   Recent Labs Lab 10/17/15 0940  WBC 13.4*  CREATININE 1.30*  LATICACIDVEN 3.8*    Estimated Creatinine Clearance: 17 mL/min (by C-G formula based on Cr of 1.3).    No Known Allergies  Antimicrobials this admission: Zosyn 2/24 >>  Vancomycin 2/24 >>   Dose adjustments this admission: N/A  Microbiology results: 2/24 BCx x 2: pending  2/24 UCx: pending  2/24 MRSA PCR: pending   Pharmacy will continue to monitor and adjust per protocol.    Simpson,Michael L 10/17/2015 2:24 PM

## 2015-10-17 NOTE — Progress Notes (Addendum)
ANTICOAGULATION CONSULT NOTE - Initial Consult  Pharmacy Consult for heparin drip management  Indication: chest pain/ACS  No Known Allergies  Patient Measurements: Height:  (154.9 cm) Weight: 87 lb 14.4 oz (39.871 kg) IBW/kg (Calculated) : 47.8   Vital Signs: Temp: 98.3 F (36.8 C) (02/24 1350) Temp Source: Rectal (02/24 1001) BP: 103/49 mmHg (02/24 1350) Pulse Rate: 113 (02/24 1330)  Labs:  Recent Labs  10/17/15 0940  HGB 11.2*  HCT 36.6  PLT 248  LABPROT 14.4  INR 1.10  CREATININE 1.30*  TROPONINI 3.08*    Estimated Creatinine Clearance: 17 mL/min (by C-G formula based on Cr of 1.3).   Medical History: Past Medical History  Diagnosis Date  . Hypertension   . GERD (gastroesophageal reflux disease)   . CHF (congestive heart failure) (HCC)   . CAD (coronary artery disease)   . Cellulitis 03/2015    Medications:  Scheduled:  . heparin  2,400 Units Intravenous Once  . simvastatin  40 mg Oral Daily   Infusions:  . heparin    . norepinephrine (LEVOPHED) Adult infusion    . vancomycin      Assessment: Pharmacy consulted for heparin drip management for 82 yo female admitted with elevated troponins.   Goal of Therapy:  Heparin level 0.3-0.7 units/ml Monitor platelets by anticoagulation protocol: Yes   Plan:  Per nursing report, patient vomited blood, will not bolus as discussed with Dr. Cherlynn Kaiser. Will start heparin drip at 600 units/hr. Will obtain anti-Xa level at 2200.   Pharmacy will continue to monitor and adjust per consult.    [name] [name], PharmD Clinical Pharmacist  10/17/2015,2:16 PM

## 2015-10-17 NOTE — Progress Notes (Signed)
*  PRELIMINARY RESULTS* Echocardiogram 2D Echocardiogram has been performed.  Garrel Ridgel Stills 10/17/2015, 8:26 PM

## 2015-10-17 NOTE — ED Notes (Signed)
This RN bladder scanned the patient and the bladder scanner showed in the bladder.  Dr. Cherlynn Kaiser aware.

## 2015-10-17 NOTE — ED Notes (Signed)
Pt arrived via EMS from [facility] with c/o shortness of breath.  EMS reported that during the night, the patient had oxygen sats on RA between 70 and 95%. BP was 86/56 as well.  Pt was afebrile.  Was given a DUONEB and Tylenol during the night as well.

## 2015-10-17 NOTE — Consult Note (Signed)
[facility] Cardiology Consultation Note  Patient ID: [name] [name], MRN: [id number], DOB/AGE: [birth year] 82 y.o. Admit date: 10/17/2015   Date of Consult: 10/17/2015 Primary Physician: Derwood Kaplan, MD Primary Cardiologist: F ATH  Chief Complaint:  Chief Complaint  Patient presents with  . Shortness of Breath   Reason for Consult: acute non-ST elevation myocardial infarction  HPI: 82 y.o. female with known coronary artery disease apparent congestive heart failure with abnormal EKG normal sinus rhythm with left bundle-branch block having acute hypotension shortness of breath weakness and new onset acute on chronic kidney disease with elevated creatinine and lower glomerular filtration rate. The patient has had an elevated troponin possibly consistent with non-ST elevation myocardial infarction likely secondary to her her current issues of illness and or possible sepsis. Previously the patient was treated with appropriate hypertension medication management and treatment for cardiomyopathy as well as hyperlipidemia previously stable. Now the patient is reasonably hemodynamically stable but is severely short of breath and not conversing  Past Medical History  Diagnosis Date  . Hypertension   . GERD (gastroesophageal reflux disease)   . CHF (congestive heart failure) (HCC)   . CAD (coronary artery disease)   . Cellulitis 03/2015      Surgical History:  Past Surgical History  Procedure Laterality Date  . Rib fracture surgery    . Coronary artery bypass graft       Home Meds: Prior to Admission medications   Medication Sig Start Date End Date Taking? Authorizing Provider  antiseptic oral rinse (CPC / CETYLPYRIDINIUM CHLORIDE 0.05%) 0.05 % LIQD solution 7 mLs by Mouth Rinse route 2 times daily at 12 noon and 4 pm. 08/23/15  Yes Katharina Caper, MD  aspirin EC 81 MG tablet Take 1 tablet (81 mg total) by mouth daily. 08/23/15  Yes Katharina Caper, MD  cetirizine (ZYRTEC) 10 MG tablet  Take 10 mg by mouth daily as needed for allergies.    Yes Historical Provider, MD  chlorhexidine (PERIDEX) 0.12 % solution 15 mLs by Mouth Rinse route 2 (two) times daily. 08/23/15  Yes Katharina Caper, MD  feeding supplement, ENSURE ENLIVE, (ENSURE ENLIVE) LIQD Take 237 mLs by mouth 2 (two) times daily with a meal. 08/23/15  Yes Katharina Caper, MD  furosemide (LASIX) 20 MG tablet Take 1 tablet (20 mg total) by mouth daily. 04/17/15  Yes Alford Highland, MD  levothyroxine (SYNTHROID, LEVOTHROID) 25 MCG tablet Take 1 tablet (25 mcg total) by mouth daily before breakfast. 08/23/15  Yes Katharina Caper, MD  mupirocin ointment (BACTROBAN) 2 % Place into the nose 2 (two) times daily. 08/23/15  Yes Katharina Caper, MD  omeprazole (PRILOSEC) 20 MG capsule Take 20 mg by mouth daily.    Yes Historical Provider, MD  potassium chloride (K-DUR,KLOR-CON) 10 MEQ tablet Take 10 mEq by mouth daily.    Yes Historical Provider, MD  quinapril (ACCUPRIL) 20 MG tablet Take 20 mg by mouth daily.    Yes Historical Provider, MD  simvastatin (ZOCOR) 40 MG tablet Take 40 mg by mouth daily.    Yes Historical Provider, MD    Inpatient Medications:  . aspirin EC  81 mg Oral Daily  . chlorhexidine  15 mL Mouth Rinse BID  . [START ON 10/18/2015] levothyroxine  25 mcg Oral QAC breakfast  . pantoprazole  40 mg Oral Daily  . piperacillin-tazobactam (ZOSYN)  IV  3.375 g Intravenous Q12H  . [START ON 10/18/2015] potassium chloride  10 mEq Oral Daily  . simvastatin  40 mg Oral Daily  . vancomycin  1,000 mg Intravenous Once   . sodium chloride    . heparin    . norepinephrine (LEVOPHED) Adult infusion      Allergies: No Known Allergies  Social History   Social History  . Marital Status: Widowed    Spouse Name: N/A  . Number of Children: N/A  . Years of Education: N/A   Occupational History  . Not on file.   Social History Main Topics  . Smoking status: Former Games developer  . Smokeless tobacco: Never Used  . Alcohol Use: No  .  Drug Use: No  . Sexual Activity: Not on file   Other Topics Concern  . Not on file   Social History Narrative   Lives at home with son. Uses a walker     Family History  Problem Relation Age of Onset  . Hypertension Mother   . CAD Father      Review of Systems P unable to assess due to obtundation  Labs:  Recent Labs  10/17/15 0940  TROPONINI 3.08*   Lab Results  Component Value Date   WBC 13.4* 10/17/2015   HGB 11.2* 10/17/2015   HCT 36.6 10/17/2015   MCV 73.0* 10/17/2015   PLT 248 10/17/2015    Recent Labs Lab 10/17/15 0940  NA 133*  K 4.3  CL 98*  CO2 23  BUN 34*  CREATININE 1.30*  CALCIUM 8.9  PROT 7.2  BILITOT 0.4  ALKPHOS 121  ALT 31  AST 72*  GLUCOSE 115*   Lab Results  Component Value Date   CHOL 130 08/21/2015   HDL 54 08/21/2015   LDLCALC 64 08/21/2015   TRIG 60 08/21/2015   No results found for: DDIMER  Radiology/Studies:  Dg Chest Port 1 View  10/17/2015  CLINICAL DATA:  Progressive cough EXAM: PORTABLE CHEST 1 VIEW COMPARISON:  August 20, 2015 FINDINGS: There is extensive airspace opacity throughout the right mid and lower lung zones. There is patchy atelectasis in the left base. Heart is mildly enlarged with pulmonary vascularity within normal limits. No adenopathy. There is a sizable paraesophageal type hernia. Patient is status post coronary artery bypass grafting. No adenopathy evident. IMPRESSION: Pneumonia in the right mid and lower lung zones. Atelectasis left base. 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 Stable cardiac silhouette. Followup PA and lateral chest radiographs recommended in 3-4 weeks following trial of antibiotic therapy to ensure resolution and exclude underlying malignancy. Electronically Signed   By: Bretta Bang III M.D.   On: 10/17/2015 10:39    EKG: Normal sinus rhythm with left bundle-branch block  Weights: Filed Weights   10/17/15 1001  Weight: 87 lb 14.4 oz (39.871 kg)     Physical Exam: Blood  pressure 70/50, pulse 98, temperature 98.2 F (36.8 C), temperature source Rectal, resp. rate 23, height  (1.549 m), weight 87 lb 14.4 oz (39.871 kg), SpO2 99 %. Body mass index is 16.62 kg/(m^2). General: Well developed, well nourished, in no acute distress. Head eyes ears nose throat: Normocephalic, atraumatic, sclera non-icteric, no xanthomas, nares are without discharge. No apparent thyromegaly and/or mass  Lungs: Normal respiratory effort.  Diffuse wheezes, basilar rales, no rhonchi.  Heart: RRR with normal S1 S2. no murmur gallop, no rub, PMI is normal size and placement, carotid upstroke normal without bruit, jugular venous pressure is normal Abdomen: Soft, non-tender, non-distended with normoactive bowel sounds. No hepatomegaly. No rebound/guarding. No obvious abdominal masses. Abdominal aorta is normal size without bruit  Extremities: Trace edema. no cyanosis, no clubbing, no ulcers  Peripheral : 2+ bilateral upper extremity pulses, 2+ bilateral femoral pulses, 2+ bilateral dorsal pedal pulse Neuro: Not Alert and oriented. No facial asymmetry. No focal deficit. Moves all extremities spontaneously. Musculoskeletal: Normal muscle tone with  kyphosis      Assessment: 82 year old female with acute sepsis and illness with some hypotension and acute non-ST elevation myocardial infarction due to this illness with some congestive heart failure pulmonary edema and hypoxia  Plan: Continue aspirin for acute non-ST elevation myocardial infarction 2. Treatment with antibiotics of sepsis and possible pneumonia 3. Pressure support getting gentle hydration watching closely for worsening congestive heart failure to treat possible concerns of acute on chronic kidney disease 4. No further cardiac diagnostics necessary at this time 5. Further adjustments of medication management as able  Signed, Lamar Blinks M.D. [facility] Cardiology 10/17/2015, 3:15 PM

## 2015-10-17 NOTE — ED Notes (Signed)
Admitting MD at bedside.

## 2015-10-17 NOTE — ED Notes (Signed)
Unable to obtain second culture set from patient.

## 2015-10-17 NOTE — ED Provider Notes (Signed)
Time Seen: Approximately *0940  I have reviewed the triage notes  Chief Complaint: Shortness of Breath   History of Present Illness: [name] [name] is a 82 y.o. female who presents as a code sepsis with identified fever, tachycardia, and hypotension with indications of septic shock. Patient apparently started feeling ill last night at the nursing facility. She was given DuoNeb and Tylenol. Her blood pressure was low and she had continued increased respiratory distress. Pulse oximetry was checked at the nursing facility [facility] and was noted to be low. Patient was transported here by EMS. The patient herself is a limited historian but does not appear to be in any respiratory distress at this time. She denies any pain in the chest or abdomen Past Medical History  Diagnosis Date  . Hypertension   . GERD (gastroesophageal reflux disease)   . CHF (congestive heart failure) (HCC)   . CAD (coronary artery disease)   . Cellulitis 03/2015    Patient Active Problem List   Diagnosis Date Noted  . Sepsis (HCC) 10/17/2015  . Dyspnea 08/23/2015  . Elevated troponin 08/23/2015  . Hypothermia 08/23/2015  . Hypothyroidism 08/23/2015  . Generalized weakness 08/23/2015  . Cardiomyopathy (HCC) 08/23/2015  . Protein-calorie malnutrition, severe 08/21/2015  . Symptomatic bradycardia 08/20/2015  . Hyperlipidemia 05/27/2015  . HTN (hypertension) 05/27/2015  . Chronic systolic heart failure (HCC) 04/25/2015  . Circulation problem 04/25/2015  . Cellulitis 04/14/2015    Past Surgical History  Procedure Laterality Date  . Rib fracture surgery    . Coronary artery bypass graft      Past Surgical History  Procedure Laterality Date  . Rib fracture surgery    . Coronary artery bypass graft      No current outpatient prescriptions on file.  Allergies:  Review of patient's allergies indicates no known allergies.  Family History: Family History  Problem Relation Age of Onset   . Hypertension Mother   . CAD Father     Social History: Social History  Substance Use Topics  . Smoking status: Former Games developer  . Smokeless tobacco: Never Used  . Alcohol Use: No     Review of Systems:   10 point review of systems was performed and was otherwise negative:  Constitutional: Fever identified at the time of transport Eyes: No visual disturbances ENT: No sore throat, ear pain Cardiac: No chest pain Respiratory: Patient had complaints of shortness of breath at the nursing facility Abdomen: No abdominal pain, no vomiting, No diarrhea Endocrine: No weight loss, No night sweats Extremities: No peripheral edema, cyanosis Skin: No rashes, easy bruising Neurologic: No focal weakness, trouble with speech or swollowing Urologic: No dysuria, Hematuria, or urinary frequency   Physical Exam:  ED Triage Vitals  Enc Vitals Group     BP 10/17/15 0936 72/48 mmHg     Pulse Rate 10/17/15 0936 127     Resp 10/17/15 0936 38     Temp 10/17/15 1001 100.2 F (37.9 C)     Temp Source 10/17/15 1001 Rectal     SpO2 10/17/15 0936 89 %     Weight 10/17/15 1001 87 lb 14.4 oz (39.871 kg)     Height 10/17/15 1001 5\' 1"  (1.549 m)     Head Cir --      Peak Flow --      Pain Score --      Pain Loc --      Pain Edu? --      Excl.  in GC? --     General: Awake , Alert , and Oriented times 2 periodically lethargic but generally able to answer questions she denies any physical pain at this point patient's somewhat cachectic Head: Normal cephalic , atraumatic Eyes: Pupils equal , round, reactive to light Nose/Throat: No nasal drainage, patent upper airway without erythema or exudate.  Neck: Limited range of motion, No anterior adenopathy or palpable thyroid masses Lungs: Coarse breath sounds auscultated at the right base without any wheezes or rhonchi Heart: Regular rate, regular rhythm without murmurs , gallops , or rubs Abdomen: Soft, non tender without rebound, guarding , or  rigidity; bowel sounds positive and symmetric in all 4 quadrants. No organomegaly .        Extremities: 2 plus symmetric pulses. No edema, clubbing or cyanosis Neurologic:  Motor symmetric without deficits, sensory intact Skin: warm, dry, no rashes Significant kyphosis  Labs:   All laboratory work was reviewed including any pertinent negatives or positives listed below:  Labs Reviewed  COMPREHENSIVE METABOLIC PANEL - Abnormal; Notable for the following:    Sodium 133 (*)    Chloride 98 (*)    Glucose, Bld 115 (*)    BUN 34 (*)    Creatinine, Ser 1.30 (*)    AST 72 (*)    GFR calc non Af Amer 34 (*)    GFR calc Af Amer 40 (*)    All other components within normal limits  CBC WITH DIFFERENTIAL/PLATELET - Abnormal; Notable for the following:    WBC 13.4 (*)    Hemoglobin 11.2 (*)    MCV 73.0 (*)    MCH 22.3 (*)    MCHC 30.5 (*)    RDW 21.2 (*)    Neutro Abs 12.3 (*)    Lymphs Abs 0.4 (*)    All other components within normal limits  LACTIC ACID, PLASMA - Abnormal; Notable for the following:    Lactic Acid, Venous 3.8 (*)    All other components within normal limits  TROPONIN I - Abnormal; Notable for the following:    Troponin I 3.08 (*)    All other components within normal limits  BLOOD GAS, ARTERIAL - Abnormal; Notable for the following:    pH, Arterial 7.32 (*)    pO2, Arterial 76 (*)    Acid-base deficit 2.9 (*)    Allens test (pass/fail) POSITIVE (*)    All other components within normal limits  RAPID INFLUENZA A&B ANTIGENS ([facility] ONLY)  CULTURE, BLOOD (ROUTINE X 2)  CULTURE, BLOOD (ROUTINE X 2)  URINE CULTURE  MRSA PCR SCREENING  PROTIME-INR  LACTIC ACID, PLASMA  URINALYSIS COMPLETEWITH MICROSCOPIC ([facility] ONLY)  TROPONIN I  TROPONIN I  TROPONIN I  APTT  HEPARIN LEVEL (UNFRACTIONATED)   reviewed the patient's laboratory work shows an elevated white blood cell count, elevated lactic acid level. She also has an elevated troponin but no obvious ischemic changes  seen on her EKG    EKG: * ED ECG REPORT I, Jennye Moccasin, the attending physician, personally viewed and interpreted this ECG.  Date: 10/17/2015 EKG Time: *0934 Rate: 126 Rhythm: Sinus tachycardia QRS Axis: Left axis deviation Intervals: normal ST/T Wave abnormalities: normal Conduction Disturbances: Left bundle branch block Narrative Interpretation: unremarkable No acute ischemic changes are noted   Radiology: *  EXAM: PORTABLE CHEST 1 VIEW  COMPARISON: August 20, 2015  FINDINGS: There is extensive airspace opacity throughout the right mid and lower lung zones. There is patchy atelectasis in the left  base. Heart is mildly enlarged with pulmonary vascularity within normal limits. No adenopathy. There is a sizable paraesophageal type hernia. Patient is status post coronary artery bypass grafting. No adenopathy evident.  IMPRESSION: Pneumonia in the right mid and lower lung zones. Atelectasis left base. 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 Stable cardiac silhouette.  Followup PA and lateral chest radiographs recommended in 3-4 weeks following trial of antibiotic therapy to ensure resolution and exclude underlying malignancy.    I personally reviewed the radiologic studies   Procedures:  Central line The patient's right subclavian and right internal jugular area was prepped and draped in sterile fashion. The patient had an attempt for a right subclavian line without good flow with venous return. The patient then had ultrasound evaluation of the right side of her neck for internal jugular. We also attempted here in the procedures difficult to perform because patient cannot lie flat due to kyphosis and also cannot move her head to the side due to arthritis. We also attempted a right femoral stick with feeling of her right femoral artery but were unable to establish any consistent venous return with a stick the patient did not have any further attempts at this time. And is  currently more stable with her blood pressure.*   Critical Care: * CRITICAL CARE Performed by: Jennye Moccasin   Total critical care time: 47 minutes  Critical care time was exclusive of separately billable procedures and treating other patients.  Critical care was necessary to treat or prevent imminent or life-threatening deterioration.  Critical care was time spent personally by me on the following activities: development of treatment plan with patient and/or surrogate as well as nursing, discussions with consultants, evaluation of patient's response to treatment, examination of patient, obtaining history from patient or surrogate, ordering and performing treatments and interventions, ordering and review of laboratory studies, ordering and review of radiographic studies, pulse oximetry and re-evaluation of patient's condition. Initial treatment and evaluation for sepsis.   ED Course:  Patient had 2 peripheral IVs established and was initiated on code sepsis criteria for tachycardia with a temperature. The source of her sepsis appears to be some right lower lobe pneumonia which may be facility acquired, community-acquired, or aspiration. The patient had attempt at Foley catheter and no significant urine was acquired here in emergency department. The patient's blood pressure was significantly low and she was initiated on fluid resuscitation and had periodic elevations from a local of 72 2 into the low 100s.. Otherwise her blood pressure seemed labile in nature. Patient was initiated on Zosyn and vancomycin with identification of the pneumonia.  A discussion was performed with the family and the patient and they request a DO NOT RESUSCITATE criteria with no chest compressions and/or airway management. She was on a nasal cannula and briefly on a fascial here in emergency department.  Assessment: Sepsis Pneumonia   Final Clinical Impression:  * Final diagnoses:  Healthcare-associated  pneumonia     Plan: Inpatient management           Jennye Moccasin, MD 10/17/15 1616

## 2015-10-17 NOTE — Progress Notes (Signed)
Received call from [facility] reguarding critical lab value troponin 8.23 and notified eMD Dr. Christene Slates.

## 2015-10-17 NOTE — Consult Note (Addendum)
PULMONARY / CRITICAL CARE MEDICINE   Name: [name] [name] MRN: [id number] DOB: March 28, 1920    ADMISSION DATE:  10/17/2015   PT PROFILE:  45 F admitted via ED from SNF with fever, dyspnea, hypotension, severe sepsis and pulmonary infiltrates c/w PNA after episode of vomiting. Symptoms started on evening prior to admission. In the ED she received 1.5 liters NS with some improvement in BP an cognition. She was placed on BiPAP for respiratory distress. PCCM asked to see pt for possible CVL placement for vasopressors. At the time of this evaluation her BP was marginal and became adequate on very low dose norepinephrine. Pt is DNR/DNI   MAJOR EVENTS/TEST RESULTS:   INDWELLING DEVICES::   MICRO DATA: MRSA PCR 02/24 >> NEG Flu nasal swab 02/24 >> NEG Blood 02/24 >>  Urine 02/24 >>   ANTIMICROBIALS:  Vanc 02/24 >>  Zosyn 02/24 >>   HISTORY OF PRESENT ILLNESS:   As above. Pt is unable to offer much meaningful history.  PAST MEDICAL HISTORY :  She  has a past medical history of Hypertension; GERD (gastroesophageal reflux disease); CHF (congestive heart failure) (HCC); CAD (coronary artery disease); and Cellulitis (03/2015).  PAST SURGICAL HISTORY: She  has past surgical history that includes Rib fracture surgery and Coronary artery bypass graft.  No Known Allergies  No current facility-administered medications on file prior to encounter.   Current Outpatient Prescriptions on File Prior to Encounter  Medication Sig  . antiseptic oral rinse (CPC / CETYLPYRIDINIUM CHLORIDE 0.05%) 0.05 % LIQD solution 7 mLs by Mouth Rinse route 2 times daily at 12 noon and 4 pm.  . aspirin EC 81 MG tablet Take 1 tablet (81 mg total) by mouth daily.  . cetirizine (ZYRTEC) 10 MG tablet Take 10 mg by mouth daily as needed for allergies.   . chlorhexidine (PERIDEX) 0.12 % solution 15 mLs by Mouth Rinse route 2 (two) times daily.  . feeding supplement, ENSURE ENLIVE, (ENSURE ENLIVE) LIQD Take 237 mLs by mouth  2 (two) times daily with a meal.  . furosemide (LASIX) 20 MG tablet Take 1 tablet (20 mg total) by mouth daily.  Marland Kitchen levothyroxine (SYNTHROID, LEVOTHROID) 25 MCG tablet Take 1 tablet (25 mcg total) by mouth daily before breakfast.  . mupirocin ointment (BACTROBAN) 2 % Place into the nose 2 (two) times daily.  Marland Kitchen omeprazole (PRILOSEC) 20 MG capsule Take 20 mg by mouth daily.   . potassium chloride (K-DUR,KLOR-CON) 10 MEQ tablet Take 10 mEq by mouth daily.   . quinapril (ACCUPRIL) 20 MG tablet Take 20 mg by mouth daily.   . simvastatin (ZOCOR) 40 MG tablet Take 40 mg by mouth daily.     FAMILY HISTORY:  Her indicated that her mother is deceased. She indicated that her father is deceased.   SOCIAL HISTORY: She  reports that she has quit smoking. She has never used smokeless tobacco. She reports that she does not drink alcohol or use illicit drugs.  REVIEW OF SYSTEMS:   UNable to obtain due to altered cognition  SUBJECTIVE:    VITAL SIGNS: BP 128/57 mmHg  Pulse 84  Temp(Src) 90.7 F (32.6 C) (Rectal)  Resp 19  Ht  (1.626 m)  Wt 97 lb (44 kg)  BMI 16.64 kg/m2  SpO2 99%  HEMODYNAMICS:    VENTILATOR SETTINGS:    INTAKE / OUTPUT: I/O last 3 completed shifts: In: 861.9 [I.V.:361.9; IV Piggyback:500] Out: -   PHYSICAL EXAMINATION: General: Frail, elderly, no respiratory distress off BiPAP  on [place] O2 Neuro: RASS 0, + F/C, CNs intact, motor and sensory exam intact, DTRs symmetric HEENT: NCAT, very dry oral mucosa Cardiovascular: mildly tachycardic, regular, no M Lungs: coarse BS without wheezes Abdomen: Soft, NT, +BS Ext: cool, no edema  LABS:  BMET  Recent Labs Lab 10/17/15 0940  NA 133*  K 4.3  CL 98*  CO2 23  BUN 34*  CREATININE 1.30*  GLUCOSE 115*    Electrolytes  Recent Labs Lab 10/17/15 0940  CALCIUM 8.9    CBC  Recent Labs Lab 10/17/15 0940  WBC 13.4*  HGB 11.2*  HCT 36.6  PLT 248    Coag's  Recent Labs Lab 10/17/15 0940  10/17/15 1550  APTT  --  34  INR 1.10  --     Sepsis Markers  Recent Labs Lab 10/17/15 0940 10/17/15 1550  LATICACIDVEN 3.8* 1.5    ABG  Recent Labs Lab 10/17/15 1210  PHART 7.32*  PCO2ART 45  PO2ART 76*    Liver Enzymes  Recent Labs Lab 10/17/15 0940  AST 72*  ALT 31  ALKPHOS 121  BILITOT 0.4  ALBUMIN 3.5    Cardiac Enzymes  Recent Labs Lab 10/17/15 0940 10/17/15 1550  TROPONINI 3.08* 8.23*    Glucose No results for input(s): GLUCAP in the last 168 hours.  CXR: R infrahilar opacity c/w PNA, hiatal hernia    ASSESSMENT / PLAN:  PULMONARY A: Acute hypoxic respiratory failure due to PNA P:   Supplemental O2 to maintain  SpO2 > 90% BiPAP changed to PRN DNI  CARDIOVASCULAR A:  Septic and hypovolemic shock NSTEMI - likely stress/demand induced P:  Additional NS bolus given Wean norepinephrine as able for MAP > 65 mmHg Will place CVL if NE requirements increase Heparin gtt per primary team Cards consult requested. She does not appear to be a candidate for invasive evaluation  RENAL A:   AKI due to septic shock, hypovolemia P:   Monitor BMET intermittently Monitor I/Os Correct electrolytes as indicated Not a candidate for HD of any duration  GASTROINTESTINAL A:   Hiatal hernia with emesis Chronic PPI use P:   SUP: IV PPI NPO until cognition improves Was previously on dysphagia diet  HEMATOLOGIC A:   Chronic anemia without acute blood loss P:  DVT px: SQ heparin Monitor CBC intermittently Transfuse per usual guidelines  INFECTIOUS A:   PNA - suspect aspiration given hx of vomiting and dysphagia P:   Monitor temp, WBC count Micro and abx as above  ENDOCRINE A:   No issues P:   Monitor glu on chem panels Consider SSI if glu > 180  NEUROLOGIC A:   Acute encephalopathy P:   RASS goal: 0 Monitor     Billy Fischer, MD PCCM service Mobile [phone] Pager [phone]    10/17/2015, 8:41 PM

## 2015-10-17 NOTE — H&P (Addendum)
[facility] - [place] at [facility]   PATIENT NAME: [name] [name]    MR#:  [id number]  DATE OF BIRTH:  [birth year]  DATE OF ADMISSION:  10/17/2015  PRIMARY CARE PHYSICIAN: Derwood Kaplan, MD   REQUESTING/REFERRING PHYSICIAN: Dr. Lacretia Nicks  CHIEF COMPLAINT:   Chief Complaint  Patient presents with  . Shortness of Breath    HISTORY OF PRESENT ILLNESS:  [name] [name]  is a 82 y.o. female with a known history of hypertension, GERD, history of CHF, history of coronary artery disease, hypothyroidism who presents to the hospital due to fever and shortness of breath. Patient presents from a skilled nursing facility as she was noted to have a low-grade fever and also noted to be short of breath and noted to be hypoxic with O2 sats in the 70s to 80s. Patient was sent to the ER for further evaluation and noted to be hypotensive with systolic blood pressures in the 70s and also a fever of 100.2. Patient's chest x-ray findings are suggestive of a right-sided pneumonia in the right mid and lower lobes. Patient is being admitted for code sepsis secondary to underlying pneumonia. Patient incidentally was also noted to have an elevated troponin of 3 but denies any acute chest pain. She's also been having some persistent nausea and vomiting now for the past couple days. She denies any abdominal pain, diarrhea, or any other associated symptoms presently.  PAST MEDICAL HISTORY:   Past Medical History  Diagnosis Date  . Hypertension   . GERD (gastroesophageal reflux disease)   . CHF (congestive heart failure) (HCC)   . CAD (coronary artery disease)   . Cellulitis 03/2015    PAST SURGICAL HISTORY:   Past Surgical History  Procedure Laterality Date  . Rib fracture surgery    . Coronary artery bypass graft      SOCIAL HISTORY:   Social History  Substance Use Topics  . Smoking status: Former Games developer  . Smokeless tobacco: Never Used  . Alcohol Use: No    FAMILY  HISTORY:   Family History  Problem Relation Age of Onset  . Hypertension Mother   . CAD Father     DRUG ALLERGIES:  No Known Allergies  REVIEW OF SYSTEMS:   Review of Systems  Constitutional: Negative for fever and weight loss.  HENT: Negative for congestion, nosebleeds and tinnitus.   Eyes: Negative for blurred vision, double vision and redness.  Respiratory: Positive for cough, sputum production and shortness of breath. Negative for hemoptysis.   Cardiovascular: Negative for chest pain, orthopnea, leg swelling and PND.  Gastrointestinal: Positive for nausea and vomiting. Negative for abdominal pain, diarrhea and melena.  Genitourinary: Negative for dysuria, urgency and hematuria.  Musculoskeletal: Negative for joint pain and falls.  Neurological: Positive for weakness (generalized). Negative for dizziness, tingling, sensory change, focal weakness, seizures and headaches.  Endo/Heme/Allergies: Negative for polydipsia. Does not bruise/bleed easily.  Psychiatric/Behavioral: Negative for depression and memory loss. The patient is not nervous/anxious.     MEDICATIONS AT HOME:   Prior to Admission medications   Medication Sig Start Date End Date Taking? Authorizing Provider  antiseptic oral rinse (CPC / CETYLPYRIDINIUM CHLORIDE 0.05%) 0.05 % LIQD solution 7 mLs by Mouth Rinse route 2 times daily at 12 noon and 4 pm. 08/23/15  Yes Katharina Caper, MD  aspirin EC 81 MG tablet Take 1 tablet (81 mg total) by mouth daily. 08/23/15  Yes Katharina Caper, MD  cetirizine (ZYRTEC) 10 MG tablet Take  10 mg by mouth daily as needed for allergies.    Yes Historical Provider, MD  chlorhexidine (PERIDEX) 0.12 % solution 15 mLs by Mouth Rinse route 2 (two) times daily. 08/23/15  Yes Katharina Caper, MD  feeding supplement, ENSURE ENLIVE, (ENSURE ENLIVE) LIQD Take 237 mLs by mouth 2 (two) times daily with a meal. 08/23/15  Yes Katharina Caper, MD  furosemide (LASIX) 20 MG tablet Take 1 tablet (20 mg total) by  mouth daily. 04/17/15  Yes Alford Highland, MD  levothyroxine (SYNTHROID, LEVOTHROID) 25 MCG tablet Take 1 tablet (25 mcg total) by mouth daily before breakfast. 08/23/15  Yes Katharina Caper, MD  mupirocin ointment (BACTROBAN) 2 % Place into the nose 2 (two) times daily. 08/23/15  Yes Katharina Caper, MD  omeprazole (PRILOSEC) 20 MG capsule Take 20 mg by mouth daily.    Yes Historical Provider, MD  potassium chloride (K-DUR,KLOR-CON) 10 MEQ tablet Take 10 mEq by mouth daily.    Yes Historical Provider, MD  quinapril (ACCUPRIL) 20 MG tablet Take 20 mg by mouth daily.    Yes Historical Provider, MD  simvastatin (ZOCOR) 40 MG tablet Take 40 mg by mouth daily.    Yes Historical Provider, MD      VITAL SIGNS:  Blood pressure 73/48, pulse 109, temperature 98.1 F (36.7 C), temperature source Rectal, resp. rate 37, height  (1.549 m), weight 39.871 kg (87 lb 14.4 oz), SpO2 97 %.  PHYSICAL EXAMINATION:  Physical Exam  GENERAL:  82 y.o.-year-old patient lying in the bed in mild to moderate respiratory distress and nauseated.  EYES: Pupils equal, round, reactive to light and accommodation. No scleral icterus. Extraocular muscles intact.  HEENT: Head atraumatic, normocephalic. Oropharynx and nasopharynx clear. No oropharyngeal erythema, moist oral mucosa  NECK:  Supple, no jugular venous distention. No thyroid enlargement, no tenderness.  LUNGS: Diffuse rhonchi bilaterally. Positive upper airway rattling. Positive use of accessory muscles. CARDIOVASCULAR: S1, S2, tachycardic. No murmurs, rubs, gallops, clicks.  ABDOMEN: Soft, nontender, nondistended. Bowel sounds present. No organomegaly or mass.  EXTREMITIES: No pedal edema, cyanosis, or clubbing. + 2 pedal & radial pulses b/l.   NEUROLOGIC: Cranial nerves II through XII are intact. No focal Motor or sensory deficits appreciated b/l.  Globally weak PSYCHIATRIC: The patient is alert and oriented x 3.  SKIN: No obvious rash, lesion, or ulcer.    LABORATORY PANEL:   CBC  Recent Labs Lab 10/17/15 0940  WBC 13.4*  HGB 11.2*  HCT 36.6  PLT 248   ------------------------------------------------------------------------------------------------------------------  Chemistries   Recent Labs Lab 10/17/15 0940  NA 133*  K 4.3  CL 98*  CO2 23  GLUCOSE 115*  BUN 34*  CREATININE 1.30*  CALCIUM 8.9  AST 72*  ALT 31  ALKPHOS 121  BILITOT 0.4   ------------------------------------------------------------------------------------------------------------------  Cardiac Enzymes  Recent Labs Lab 10/17/15 0940  TROPONINI 3.08*   ------------------------------------------------------------------------------------------------------------------  RADIOLOGY:  Dg Chest Port 1 View  10/17/2015  CLINICAL DATA:  Progressive cough EXAM: PORTABLE CHEST 1 VIEW COMPARISON:  August 20, 2015 FINDINGS: There is extensive airspace opacity throughout the right mid and lower lung zones. There is patchy atelectasis in the left base. Heart is mildly enlarged with pulmonary vascularity within normal limits. No adenopathy. There is a sizable paraesophageal type hernia. Patient is status post coronary artery bypass grafting. No adenopathy evident. IMPRESSION: Pneumonia in the right mid and lower lung zones. Atelectasis left base. 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 Stable cardiac silhouette. Followup PA and lateral chest radiographs recommended  in 3-4 weeks following trial of antibiotic therapy to ensure resolution and exclude underlying malignancy. Electronically Signed   By: Bretta Bang III M.D.   On: 10/17/2015 10:39     IMPRESSION AND PLAN:   82 year old female with past medical history of hypertension, hyperlipidemia, hypothyroidism, GERD, history of CHF who presents to the hospital due to shortness of breath, fever and noted to be septic.  #1 sepsis-patient meets criteria given her fever, tachypnea, tachycardia and chest x-ray findings  suggestive of a right mid and lower lobe pneumonia. -Continue IV fluids, antipyretics, broad-spectrum IV antibiotics with vancomycin, Zosyn. -Follow blood, sputum cultures. - if needed will start Vasopressors (Levophed).    #2 pneumonia-suspected aspiration pneumonia. -Continue vancomycin, Zosyn for now. Follow sputum and blood cultures. -We will keep nothing by mouth for now and get a speech therapy evaluation when respiratory status stable.  #3 acute respiratory failure with hypoxia-secondary to pneumonia. -Continue supportive care with O2 supplementation, IV antibiotics and pulmonary toileting. -Patient is a DO NOT RESUSCITATE and we'll attempt BiPAP if needed. -I will get an ABG.  #4 non-ST elevation MI-patient's troponin is 3.0. She denies any acute chest pain presently. -I will start her on a heparin nomogram. I will get a cardiology consult, check a two-dimensional echocardiogram. -Continue aspirin, statin. -Given her advanced age and comorbidities she is likely not a candidate for aggressive intervention.  #5 acute renal failure-likely due to ATN secondary to sepsis.  -continue IV fluids, follow BUN/creatinine. -Renal dose meds and avoid nephrotoxins.  #6 hypothyroidism-continue Synthroid.  #7 GERD-continue Protonix  All the records are reviewed and case discussed with ED provider. Management plans discussed with the patient, family and they are in agreement.  CODE STATUS: DO NOT RESUSCITATE  TOTAL Critical Care TIME TAKING CARE OF THIS PATIENT: 50 minutes.    Houston Siren M.D on 10/17/2015 at 11:45 AM  Between 7am to 6pm - Pager - [phone]  After 6pm go to [email or web address] - password [email or web address] [facility]  [facility]  Office  [phone]  CC: Primary care physician; Derwood Kaplan, MD

## 2015-10-17 NOTE — Progress Notes (Addendum)
Notified Katherine Mantle, RN to tell MD on call that patient's troponin is 8.23- it trended up from 3.08.  Patient is on heparin drip.  Waiting on MD to call back.

## 2015-10-18 ENCOUNTER — Inpatient Hospital Stay: Payer: Medicare Other

## 2015-10-18 DIAGNOSIS — J9621 Acute and chronic respiratory failure with hypoxia: Secondary | ICD-10-CM

## 2015-10-18 DIAGNOSIS — L8990 Pressure ulcer of unspecified site, unspecified stage: Secondary | ICD-10-CM | POA: Insufficient documentation

## 2015-10-18 DIAGNOSIS — I420 Dilated cardiomyopathy: Secondary | ICD-10-CM

## 2015-10-18 LAB — BASIC METABOLIC PANEL
ANION GAP: 6 (ref 5–15)
BUN: 36 mg/dL — AB (ref 6–20)
CHLORIDE: 106 mmol/L (ref 101–111)
CO2: 27 mmol/L (ref 22–32)
Calcium: 8 mg/dL — ABNORMAL LOW (ref 8.9–10.3)
Creatinine, Ser: 1.06 mg/dL — ABNORMAL HIGH (ref 0.44–1.00)
GFR calc Af Amer: 51 mL/min — ABNORMAL LOW (ref >60)
GFR calc non Af Amer: 44 mL/min — ABNORMAL LOW (ref >60)
GLUCOSE: 109 mg/dL — AB (ref 65–99)
POTASSIUM: 4.2 mmol/L (ref 3.5–5.1)
Sodium: 139 mmol/L (ref 135–145)

## 2015-10-18 LAB — CBC
HCT: 31.2 % — ABNORMAL LOW (ref 35.0–47.0)
HEMOGLOBIN: 9.7 g/dL — AB (ref 12.0–16.0)
MCH: 22.3 pg — AB (ref 26.0–34.0)
MCHC: 30.9 g/dL — ABNORMAL LOW (ref 32.0–36.0)
MCV: 72 fL — AB (ref 80.0–100.0)
Platelets: 195 10*3/uL (ref 150–440)
RBC: 4.34 MIL/uL (ref 3.80–5.20)
RDW: 21.2 % — ABNORMAL HIGH (ref 11.5–14.5)
WBC: 13.4 10*3/uL — ABNORMAL HIGH (ref 3.6–11.0)

## 2015-10-18 LAB — URINALYSIS COMPLETE WITH MICROSCOPIC (ARMC ONLY)
BACTERIA UA: NONE SEEN
Bilirubin Urine: NEGATIVE
GLUCOSE, UA: NEGATIVE mg/dL
Ketones, ur: NEGATIVE mg/dL
LEUKOCYTES UA: NEGATIVE
Nitrite: NEGATIVE
PH: 5 (ref 5.0–8.0)
PROTEIN: NEGATIVE mg/dL
Specific Gravity, Urine: 1.015 (ref 1.005–1.030)

## 2015-10-18 LAB — HEPARIN LEVEL (UNFRACTIONATED)
Heparin Unfractionated: 0.14 IU/mL — ABNORMAL LOW (ref 0.30–0.70)
Heparin Unfractionated: 0.29 IU/mL — ABNORMAL LOW (ref 0.30–0.70)
Heparin Unfractionated: 0.35 IU/mL (ref 0.30–0.70)

## 2015-10-18 MED ORDER — BENZONATATE 100 MG PO CAPS
200.0000 mg | ORAL_CAPSULE | Freq: Three times a day (TID) | ORAL | Status: DC | PRN
  Administered 2015-10-18: 200 mg via ORAL
  Filled 2015-10-18: qty 2

## 2015-10-18 MED ORDER — PIPERACILLIN-TAZOBACTAM 3.375 G IVPB
3.3750 g | Freq: Three times a day (TID) | INTRAVENOUS | Status: DC
  Administered 2015-10-18 – 2015-10-22 (×13): 3.375 g via INTRAVENOUS
  Filled 2015-10-18 (×16): qty 50

## 2015-10-18 MED ORDER — HEPARIN BOLUS VIA INFUSION
700.0000 [IU] | Freq: Once | INTRAVENOUS | Status: AC
  Administered 2015-10-18: 700 [IU] via INTRAVENOUS
  Filled 2015-10-18: qty 700

## 2015-10-18 MED ORDER — HEPARIN BOLUS VIA INFUSION
1200.0000 [IU] | Freq: Once | INTRAVENOUS | Status: AC
  Administered 2015-10-18: 1200 [IU] via INTRAVENOUS
  Filled 2015-10-18: qty 1200

## 2015-10-18 MED ORDER — LEVOTHYROXINE SODIUM 100 MCG IV SOLR
12.5000 ug | Freq: Every day | INTRAVENOUS | Status: DC
  Administered 2015-10-18 – 2015-10-19 (×2): 12.5 ug via INTRAVENOUS
  Filled 2015-10-18 (×2): qty 5

## 2015-10-18 NOTE — [place] FL2 (Signed)
[place] MEDICAID FL2 LEVEL OF CARE SCREENING TOOL     IDENTIFICATION  Patient Name: [name] [name] Birthdate: [birth year] Sex: female Admission Date (Current Location): 10/17/2015  [place] and [place] Number:  Chiropodist and Address:  [facility], [address] [place], [place] [address]      Provider Number: [id number]  Attending Physician Name and Address:  Houston Siren, MD  Relative Name and Phone Number:       Current Level of Care: Hospital Recommended Level of Care: Skilled Nursing Facility Prior Approval Number:    Date Approved/Denied:   PASRR Number:   [id number]  Discharge Plan: SNF    Current Diagnoses: Patient Active Problem List   Diagnosis Date Noted  . Pressure ulcer 10/18/2015  . Sepsis (HCC) 10/17/2015  . Dyspnea 08/23/2015  . Elevated troponin 08/23/2015  . Hypothermia 08/23/2015  . Hypothyroidism 08/23/2015  . Generalized weakness 08/23/2015  . Cardiomyopathy (HCC) 08/23/2015  . Protein-calorie malnutrition, severe 08/21/2015  . Symptomatic bradycardia 08/20/2015  . Hyperlipidemia 05/27/2015  . HTN (hypertension) 05/27/2015  . Chronic systolic heart failure (HCC) 04/25/2015  . Circulation problem 04/25/2015  . Cellulitis 04/14/2015    Orientation RESPIRATION BLADDER Height & Weight     Self, Time, Situation, Place  O2 2 (L/min)  Continent Weight: 97 lb (44 kg) Height:   (162.6 cm)  BEHAVIORAL SYMPTOMS/MOOD NEUROLOGICAL BOWEL NUTRITION STATUS   None   None  Continent DYS 2  AMBULATORY STATUS COMMUNICATION OF NEEDS Skin   Extensive Assist Verbally Pressure Ulcer Deep Tissue Injury- Bilateral Heel; Shoulder left Healing Wound, brown closed edges                      Personal Care Assistance Level of Assistance  Bathing, Feeding, Dressing,  Bathing Assistance: Maximum assistance Feeding assistance: Limited assistance Dressing Assistance: Maximum assistance    Functional Limitations  Info  Sight, Hearing, Speech Sight Info: Adequate Hearing Info: Impaired Speech Info: Adequate    SPECIAL CARE FACTORS FREQUENCY                      Contractures      Additional Factors Info  Allergies; Infection    Allergies Info: No Known Allergies  Infection: MRSA           Current Medications (10/18/2015):  This is the current hospital active medication list Current Facility-Administered Medications  Medication Dose Route Frequency Provider Last Rate Last Dose  . 0.9 %  sodium chloride infusion   Intravenous Continuous Houston Siren, MD 75 mL/hr at 10/18/15 1211    . acetaminophen (TYLENOL) tablet 650 mg  650 mg Oral Q6H PRN Houston Siren, MD       Or  . acetaminophen (TYLENOL) suppository 650 mg  650 mg Rectal Q6H PRN Houston Siren, MD      . aspirin EC tablet 81 mg  81 mg Oral Daily Houston Siren, MD   81 mg at 10/17/15 1507  . chlorhexidine (PERIDEX) 0.12 % solution 15 mL  15 mL Mouth Rinse BID Houston Siren, MD   15 mL at 10/18/15 0815  . heparin ADULT infusion 100 units/mL (25000 units/250 mL)  850 Units/hr Intravenous Continuous Houston Siren, MD 8.5 mL/hr at 10/18/15 1104 850 Units/hr at 10/18/15 1104  . levothyroxine (SYNTHROID, LEVOTHROID) injection 12.5 mcg  12.5 mcg Intravenous Daily Lewie Loron, NP   12.5 mcg at 10/18/15 1545  .  levothyroxine (SYNTHROID, LEVOTHROID) tablet 25 mcg  25 mcg Oral QAC breakfast Houston Siren, MD   25 mcg at 10/18/15 0734  . norepinephrine (LEVOPHED) 4 mg in dextrose 5 % 250 mL (0.016 mg/mL) infusion  2 mcg/min Intravenous Continuous Merwyn Katos, MD 7.5 mL/hr at 10/18/15 0615 2 mcg/min at 10/18/15 0615  . ondansetron (ZOFRAN) injection 4 mg  4 mg Intravenous Q6H PRN Houston Siren, MD      . pantoprazole (PROTONIX) injection 20 mg  20 mg Intravenous Q24H Merwyn Katos, MD   20 mg at 10/18/15 1545  . piperacillin-tazobactam (ZOSYN) IVPB 3.375 g  3.375 g Intravenous Q8H Houston Siren, MD   3.375 g at  10/18/15 0816  . potassium chloride (K-DUR,KLOR-CON) CR tablet 10 mEq  10 mEq Oral Daily Houston Siren, MD   10 mEq at 10/18/15 1048  . simvastatin (ZOCOR) tablet 40 mg  40 mg Oral Daily Houston Siren, MD   40 mg at 10/17/15 1508  . [START ON 10/19/2015] vancomycin (VANCOCIN) IVPB 750 mg/150 ml premix  750 mg Intravenous Q48H Bertram Savin, [facility]         Discharge Medications: Please see discharge summary for a list of discharge medications.  Relevant Imaging Results:  Relevant Lab Results:   Additional Information SSN [id number] [place], [place]

## 2015-10-18 NOTE — Progress Notes (Signed)
LCSW met with patient and consulted with ICU nurse. Patient has been doing well and would benefit from added supports at [facility]. Patient has a daughter and dear Doristine Bosworth  named Prescott. Information and data collected. Patients daughter is in a group home with schizophrenia but was very alert and oriented today. Patients son has had a stroke and resides at [facility] as well. Patient was able to answer simple questions and was oriented to person, time, place and her situation. She was very cooperative and new her SSN number and her birthday. Patient continues to improve.LCSW will complete assessment and start Fl2 and get Passr number.  Evie Crumpler LCSW

## 2015-10-18 NOTE — Progress Notes (Signed)
ANTICOAGULATION CONSULT NOTE - Initial Consult  Pharmacy Consult for heparin drip management  Indication: chest pain/ACS  No Known Allergies  Patient Measurements: Height:  (162.6 cm) Weight: 97 lb (44 kg) IBW/kg (Calculated) : 54.7   Vital Signs: Temp: 97.7 F (36.5 C) (02/25 0815) Temp Source: Oral (02/25 0815) BP: 110/49 mmHg (02/25 1015) Pulse Rate: 91 (02/25 1015)  Labs:  Recent Labs  10/17/15 0940 10/17/15 1550 10/17/15 2011 10/18/15 0125 10/18/15 1002  HGB 11.2*  --   --  9.7*  --   HCT 36.6  --   --  31.2*  --   PLT 248  --   --  195  --   APTT  --  34  --   --   --   LABPROT 14.4  --   --   --   --   INR 1.10  --   --   --   --   HEPARINUNFRC  --   --   --  0.14* 0.29*  CREATININE 1.30*  --   --  1.06*  --   TROPONINI 3.08* 8.23* 6.96*  --   --     Estimated Creatinine Clearance: 23 mL/min (by C-G formula based on Cr of 1.06).   Medical History: Past Medical History  Diagnosis Date  . Hypertension   . GERD (gastroesophageal reflux disease)   . CHF (congestive heart failure) (HCC)   . CAD (coronary artery disease)   . Cellulitis 03/2015    Medications:  Scheduled:  . aspirin EC  81 mg Oral Daily  . chlorhexidine  15 mL Mouth Rinse BID  . heparin  700 Units Intravenous Once  . levothyroxine  25 mcg Oral QAC breakfast  . pantoprazole (PROTONIX) IV  20 mg Intravenous Q24H  . piperacillin-tazobactam (ZOSYN)  IV  3.375 g Intravenous Q8H  . potassium chloride  10 mEq Oral Daily  . simvastatin  40 mg Oral Daily  . [START ON 10/19/2015] vancomycin  750 mg Intravenous Q48H   Infusions:  . sodium chloride 75 mL/hr at 10/17/15 2000  . heparin 750 Units/hr (10/18/15 0203)  . norepinephrine (LEVOPHED) Adult infusion 2 mcg/min (10/18/15 4782)    Assessment: Pharmacy consulted for heparin drip management for 82 yo female admitted with elevated troponins.   Goal of Therapy:  Heparin level 0.3-0.7 units/ml Monitor platelets by anticoagulation  protocol: Yes   Plan:  Heparin level remains below goal so will bolus heparin 700 units iv once then increase infusion to 850 units/hr. Will recheck a HL in 8 hours.   Luisa Hart, PharmD Clinical Pharmacist  10/18/2015,10:49 AM

## 2015-10-18 NOTE — Progress Notes (Signed)
LCSW obtained existing PASSR # for patient. Placed it on her current Fl2. Murphy Bundick LCSW

## 2015-10-18 NOTE — Progress Notes (Signed)
[place] at [place] NAME: [name] [name]    MR#:  [id number]  DATE OF BIRTH:  September 11, 1919  SUBJECTIVE:   Pt. Here due to acute resp. Failure due to pneumonia.  Feels a bit better today. Also septic but hypotension has improved and off vasopressors now.    REVIEW OF SYSTEMS:    Review of Systems  Constitutional: Negative for fever and chills.  HENT: Negative for congestion and tinnitus.   Eyes: Negative for blurred vision and double vision.  Respiratory: Positive for cough and shortness of breath ( improved). Negative for wheezing.   Cardiovascular: Negative for chest pain, orthopnea and PND.  Gastrointestinal: Negative for nausea, vomiting, abdominal pain and diarrhea.  Genitourinary: Negative for dysuria and hematuria.  Neurological: Negative for dizziness, sensory change and focal weakness.  All other systems reviewed and are negative.   Nutrition: NPO await Speech eval.  Tolerating Diet: Await Speech eval.  Tolerating PT: Await Eval.    DRUG ALLERGIES:  No Known Allergies  VITALS:  Blood pressure 114/60, pulse 76, temperature 97.7 F (36.5 C), temperature source Oral, resp. rate 27, height 5' 4"  (1.626 m), weight 44 kg (97 lb), SpO2 98 %.  PHYSICAL EXAMINATION:   Physical Exam  GENERAL:  82 y.o.-year-old cachectic patient lying in the bed in no acute distress.  EYES: Pupils equal, round, reactive to light and accommodation. No scleral icterus. Extraocular muscles intact.  HEENT: Head atraumatic, normocephalic. Oropharynx and nasopharynx clear.  NECK:  Supple, no jugular venous distention. No thyroid enlargement, no tenderness.  LUNGS: Poor respiratory effort. Diffuse wheezing and rhonchi bilaterally. Negative use of accessory muscles. CARDIOVASCULAR: S1, S2 normal. No murmurs, rubs, or gallops.  ABDOMEN: Soft, nontender, nondistended. Bowel sounds present. No organomegaly or mass.  EXTREMITIES: No cyanosis, clubbing or  edema b/l.    NEUROLOGIC: Cranial nerves II through XII are intact. No focal Motor or sensory deficits b/l.  Globally weak. PSYCHIATRIC: The patient is alert and oriented x 3.  SKIN: No obvious rash, lesion, or ulcer.    LABORATORY PANEL:   CBC  Recent Labs Lab 10/18/15 0125  WBC 13.4*  HGB 9.7*  HCT 31.2*  PLT 195   ------------------------------------------------------------------------------------------------------------------  Chemistries   Recent Labs Lab 10/17/15 0940 10/18/15 0125  NA 133* 139  K 4.3 4.2  CL 98* 106  CO2 23 27  GLUCOSE 115* 109*  BUN 34* 36*  CREATININE 1.30* 1.06*  CALCIUM 8.9 8.0*  AST 72*  --   ALT 31  --   ALKPHOS 121  --   BILITOT 0.4  --    ------------------------------------------------------------------------------------------------------------------  Cardiac Enzymes  Recent Labs Lab 10/17/15 2011  TROPONINI 6.96*   ------------------------------------------------------------------------------------------------------------------  RADIOLOGY:  Dg Chest Port 1 View  10/18/2015  CLINICAL DATA:  82 year old female with respiratory failure. EXAM: PORTABLE CHEST 1 VIEW COMPARISON:  10/17/2015 and prior exams FINDINGS: Cardiomediastinal silhouette is unchanged. 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 Decreased pulmonary vascular congestion noted. Bilateral lower lung atelectasis/consolidation again noted. There is no evidence of pneumothorax. No other changes are identified. IMPRESSION: Decreased pulmonary vascular congestion with continued bilateral lower lung atelectasis/ consolidation. Electronically Signed   By: Margarette Canada M.D.   On: 10/18/2015 08:11   Dg Chest Port 1 View  10/17/2015  CLINICAL DATA:  Progressive cough EXAM: PORTABLE CHEST 1 VIEW COMPARISON:  August 20, 2015 FINDINGS: There is extensive airspace opacity throughout the right mid and lower lung zones. There is patchy atelectasis in the  left base. Heart is mildly enlarged with  pulmonary vascularity within normal limits. No adenopathy. There is a sizable paraesophageal type hernia. Patient is status post coronary artery bypass grafting. No adenopathy evident. IMPRESSION: Pneumonia in the right mid and lower lung zones. Atelectasis left base. 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 Stable cardiac silhouette. Followup PA and lateral chest radiographs recommended in 3-4 weeks following trial of antibiotic therapy to ensure resolution and exclude underlying malignancy. Electronically Signed   By: Lowella Grip III M.D.   On: 10/17/2015 10:39     ASSESSMENT AND PLAN:   82 year old female with past medical history of hypertension, hyperlipidemia, hypothyroidism, GERD, history of CHF who presents to the hospital due to shortness of breath, fever and noted to be septic.  #1 sepsis-patient met criteria on admission given her fever, tachypnea, tachycardia and chest x-ray findings suggestive of a right mid and lower lobe pneumonia. -Patient was severely hypotensive and was started on IV Levophed but now has been weaned off. -Hemodynamics have improved. Continue vancomycin, Zosyn. -Follow blood, sputum cultures which are negative so far.  #2 pneumonia-suspect aspiration pneumonia. -Continue vancomycin, Zosyn for now. Follow sputum and blood cultures which are (-) so far.  -Await speech evaluation prior to initiating oral intake.  #3 acute respiratory failure with hypoxia-secondary to pneumonia. -Continue supportive care with O2 supplementation, IV antibiotics and pulmonary toileting. -Patient is a DO NOT RESUSCITATE.  Improved Since yesterday.  #4 non-ST elevation MI-patients troponin peaked as high as 8. Clinically patient is chest pain-free and hemodynamically stable. -Continue heparin nomogram, aspirin, statin. -Seen by cardiology and continue current care. Echocardiogram results pending. -Given her advanced age and comorbidities she is not a candidate for aggressive  intervention.  #5 acute renal failure-likely due to ATN secondary to sepsis.  -continue IV fluids, BUN and creatinine improving. -Renal dose meds and avoid nephrotoxins.  #6 hypothyroidism-continue Synthroid.  #7 GERD-continue Protonix    All the records are reviewed and case discussed with Care Management/Social Workerr. Management plans discussed with the patient, family and they are in agreement.  CODE STATUS: DO NOT RESUSCITATE  DVT Prophylaxis: Heparin drip  TOTAL TIME TAKING CARE OF THIS PATIENT: 30 minutes.   POSSIBLE D/C IN 2-3 DAYS, DEPENDING ON CLINICAL CONDITION.   Henreitta Leber M.D on 10/18/2015 at 3:32 PM  Between 7am to 6pm - Pager - [phone]  After 6pm go to [email or web address] - password [email or web address] [facility]  Office  [phone]  CC: Primary care physician; Marden Noble, MD

## 2015-10-18 NOTE — Progress Notes (Signed)
ANTICOAGULATION CONSULT NOTE - Initial Consult  Pharmacy Consult for heparin drip management  Indication: chest pain/ACS  No Known Allergies  Patient Measurements: Height:  (162.6 cm) Weight: 97 lb (44 kg) IBW/kg (Calculated) : 54.7   Vital Signs: Temp: 97.8 F (36.6 C) (02/25 0000) Temp Source: Axillary (02/25 0000) BP: 119/56 mmHg (02/25 0100) Pulse Rate: 97 (02/25 0100)  Labs:  Recent Labs  10/17/15 0940 10/17/15 1550 10/17/15 2011 10/18/15 0125  HGB 11.2*  --   --  9.7*  HCT 36.6  --   --  31.2*  PLT 248  --   --  195  APTT  --  34  --   --   LABPROT 14.4  --   --   --   INR 1.10  --   --   --   HEPARINUNFRC  --   --   --  0.14*  CREATININE 1.30*  --   --  1.06*  TROPONINI 3.08* 8.23* 6.96*  --     Estimated Creatinine Clearance: 23 mL/min (by C-G formula based on Cr of 1.06).   Medical History: Past Medical History  Diagnosis Date  . Hypertension   . GERD (gastroesophageal reflux disease)   . CHF (congestive heart failure) (HCC)   . CAD (coronary artery disease)   . Cellulitis 03/2015    Medications:  Scheduled:  . aspirin EC  81 mg Oral Daily  . chlorhexidine  15 mL Mouth Rinse BID  . levothyroxine  25 mcg Oral QAC breakfast  . pantoprazole (PROTONIX) IV  20 mg Intravenous Q24H  . piperacillin-tazobactam (ZOSYN)  IV  3.375 g Intravenous Q12H  . potassium chloride  10 mEq Oral Daily  . simvastatin  40 mg Oral Daily  . [START ON 10/19/2015] vancomycin  750 mg Intravenous Q48H   Infusions:  . sodium chloride 75 mL/hr at 10/17/15 2000  . heparin 600 Units/hr (10/17/15 2000)  . norepinephrine (LEVOPHED) Adult infusion 2 mcg/min (10/18/15 0146)    Assessment: Pharmacy consulted for heparin drip management for 82 yo female admitted with elevated troponins.   Goal of Therapy:  Heparin level 0.3-0.7 units/ml Monitor platelets by anticoagulation protocol: Yes   Plan:  Per nursing report, patient vomited blood, will not bolus as discussed  with Dr. Cherlynn Kaiser. Will start heparin drip at 600 units/hr. Will obtain anti-Xa level at 2200.   2/25 01:30 heparin level 0.14. 1200 unit bolus and increase rate to 750 units/hr. Recheck in 8 hours.  Pharmacy will continue to monitor and adjust per consult.    Luisa Hart, PharmD Clinical Pharmacist  10/18/2015,2:02 AM

## 2015-10-18 NOTE — Progress Notes (Signed)
Pharmacy Antibiotic Note  [name] [name] is a 82 y.o. female admitted on 10/17/2015 with sepsis.  Pharmacy has been consulted for vancomycin and Zosyn dosing. Patient received vancomycin 1g in ED on 2/24.   Plan: Will continue vancomycin  IV Q48hr for goal trough of 15-20. Will obtain trough as clinically indicated. Will adjust Zosyn dosing to 3.375 g EI q 8 hours.     Height:  (162.6 cm) Weight: 97 lb (44 kg) IBW/kg (Calculated) : 54.7  Temp (24hrs), Avg:97.9 F (36.6 C), Min:90.7 F (32.6 C), Max:100.2 F (37.9 C)   Recent Labs Lab 10/17/15 0940 10/17/15 1550 10/18/15 0125  WBC 13.4*  --  13.4*  CREATININE 1.30*  --  1.06*  LATICACIDVEN 3.8* 1.5  --     Estimated Creatinine Clearance: 23 mL/min (by C-G formula based on Cr of 1.06).    No Known Allergies  Antimicrobials this admission: Zosyn 2/24 >>  Vancomycin 2/24 >>   Dose adjustments this admission: N/A  Microbiology results: 2/24 BCx x 2: pending  2/24 UCx: pending  2/24 MRSA PCR: negative 2/24 Influenza: negative  Pharmacy will continue to monitor and adjust per protocol.    Luisa Hart D 10/18/2015 7:59 AM

## 2015-10-18 NOTE — Progress Notes (Signed)
[facility] Cardiology [facility] Encounter Note  Patient: [name] [name] / Admit Date: 10/17/2015 / Date of Encounter: 10/18/2015, 8:58 AM   Subjective: Patient is conversing this morning and still mildly short of breath but significantly improved. Telemetry shows normal sinus rhythm and. Patient is hemodynamically stable  Review of Systems: Positive for: Shortness of breath Negative for: Vision change, hearing change, syncope, dizziness, nausea, vomiting,diarrhea, bloody stool, stomach pain, cough, congestion, diaphoresis, urinary frequency, urinary pain,skin lesions, skin rashes Others previously listed  Objective: Telemetry: Normal sinus rhythm Physical Exam: Blood pressure 125/60, pulse 85, temperature 97.7 F (36.5 C), temperature source Oral, resp. rate 19, height  (1.626 m), weight 97 lb (44 kg), SpO2 100 %. Body mass index is 16.64 kg/(m^2). General: Well developed, poorly nourished, in no acute distress. Head: Normocephalic, atraumatic, sclera non-icteric, no xanthomas, nares are without discharge. Neck: No apparent masses Lungs: Normal respirations with few wheezes, no rhonchi, basilar rales , no crackles   Heart: Regular rate and rhythm, normal S1 soft S2, 2-3+ aortic murmur, no rub, no gallop, PMI is normal size and inferior placement, carotid upstroke normal with bruit, jugular venous pressure normal Abdomen: Soft, non-tender, non-distended with normoactive bowel sounds. No hepatosplenomegaly. Abdominal aorta is normal size without bruit Extremities: Trace edema, no clubbing, no cyanosis, no ulcers,  Peripheral: 2+ radial, 2+ femoral, 2+ dorsal pedal pulses Neuro: Alert and oriented. Moves all extremities spontaneously. Psych:  Responds to questions appropriately with a normal affect.   Intake/Output Summary (Last 24 hours) at 10/18/15 0858 Last data filed at 10/18/15 0816  Gross per 24 hour  Intake 2113.5 ml  Output    350 ml  Net 1763.5 ml    Inpatient  Medications:  . aspirin EC  81 mg Oral Daily  . chlorhexidine  15 mL Mouth Rinse BID  . levothyroxine  25 mcg Oral QAC breakfast  . pantoprazole (PROTONIX) IV  20 mg Intravenous Q24H  . piperacillin-tazobactam (ZOSYN)  IV  3.375 g Intravenous Q8H  . potassium chloride  10 mEq Oral Daily  . simvastatin  40 mg Oral Daily  . [START ON 10/19/2015] vancomycin  750 mg Intravenous Q48H   Infusions:  . sodium chloride 75 mL/hr at 10/17/15 2000  . heparin 750 Units/hr (10/18/15 0203)  . norepinephrine (LEVOPHED) Adult infusion 2 mcg/min (10/18/15 0615)    Labs:  Recent Labs  10/17/15 0940 10/18/15 0125  NA 133* 139  K 4.3 4.2  CL 98* 106  CO2 23 27  GLUCOSE 115* 109*  BUN 34* 36*  CREATININE 1.30* 1.06*  CALCIUM 8.9 8.0*    Recent Labs  10/17/15 0940  AST 72*  ALT 31  ALKPHOS 121  BILITOT 0.4  PROT 7.2  ALBUMIN 3.5    Recent Labs  10/17/15 0940 10/18/15 0125  WBC 13.4* 13.4*  NEUTROABS 12.3*  --   HGB 11.2* 9.7*  HCT 36.6 31.2*  MCV 73.0* 72.0*  PLT 248 195    Recent Labs  10/17/15 0940 10/17/15 1550 10/17/15 2011  TROPONINI 3.08* 8.23* 6.96*   Invalid input(s): POCBNP No results for input(s): HGBA1C in the last 72 hours.   Weights: Filed Weights   10/17/15 1001 10/17/15 1700  Weight: 87 lb 14.4 oz (39.871 kg) 97 lb (44 kg)     Radiology/Studies:  Dg Chest Port 1 View  10/18/2015  CLINICAL DATA:  82 year old female with respiratory failure. EXAM: PORTABLE CHEST 1 VIEW COMPARISON:  10/17/2015 and prior exams FINDINGS: Cardiomediastinal silhouette is unchanged. CABG  changes are present. Decreased pulmonary vascular congestion noted. Bilateral lower lung atelectasis/consolidation again noted. There is no evidence of pneumothorax. No other changes are identified. IMPRESSION: Decreased pulmonary vascular congestion with continued bilateral lower lung atelectasis/ consolidation. Electronically Signed   By: Harmon Pier M.D.   On: 10/18/2015 08:11   Dg Chest  Port 1 View  10/17/2015  CLINICAL DATA:  Progressive cough EXAM: PORTABLE CHEST 1 VIEW COMPARISON:  August 20, 2015 FINDINGS: There is extensive airspace opacity throughout the right mid and lower lung zones. There is patchy atelectasis in the left base. Heart is mildly enlarged with pulmonary vascularity within normal limits. No adenopathy. There is a sizable paraesophageal type hernia. Patient is status post coronary artery bypass grafting. No adenopathy evident. IMPRESSION: Pneumonia in the right mid and lower lung zones. Atelectasis left base. 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 Stable cardiac silhouette. Followup PA and lateral chest radiographs recommended in 3-4 weeks following trial of antibiotic therapy to ensure resolution and exclude underlying malignancy. Electronically Signed   By: Bretta Bang III M.D.   On: 10/17/2015 10:39     Assessment and Recommendation  82 y.o. female with acute on chronic systolic dysfunction congestive heart failure multifactorial in nature including severe LV systolic dysfunction with ejection fraction of 15% by echocardiogram moderate to severe aortic valve stenosis chronic kidney disease stage III and acute non-ST elevation myocardial infarction. 1. Continue Lasix for further concerns of pulmonary edema and acute on chronic systolic dysfunction heart failure watching closely for concerns of chronic kidney disease 2. Continue oxygen supplementation 3. No further cardiac diagnostics at this time necessary 4. Patient has poor long-term prognosis due to severe LV systolic dysfunction and valvular heart disease not able to further intervene other than medication management 5. Possible introduction of low-dose beta blocker if able to help with heart rate control and risk reduction of recurrent heart failure 6. Consideration of palliative care if needed  Signed, Arnoldo Hooker M.D. FACC

## 2015-10-18 NOTE — Progress Notes (Signed)
Initial Nutrition Assessment     INTERVENTION:  Meals and snacks: Await diet progression. May benefit from SLP evaluation if aspiration suspected.    NUTRITION DIAGNOSIS:   Inadequate oral intake related to acute illness as evidenced by NPO status.    GOAL:   Patient will meet greater than or equal to 90% of their needs    MONITOR:    (Energy intake)  REASON FOR ASSESSMENT:    (pressure ulcer noted)    ASSESSMENT:      Pt admitted with sepsis, aspiration pneumonia following vomiting episode, shortness of breath.  Past Medical History  Diagnosis Date  . Hypertension   . GERD (gastroesophageal reflux disease)   . CHF (congestive heart failure) (HCC)   . CAD (coronary artery disease)   . Cellulitis 03/2015    Current Nutrition: NPO  Unable to determine intake prior to admission, no family at bedside   Gastrointestinal Profile: Last BM: 2/24   Scheduled Medications:  . aspirin EC  81 mg Oral Daily  . chlorhexidine  15 mL Mouth Rinse BID  . levothyroxine  25 mcg Oral QAC breakfast  . pantoprazole (PROTONIX) IV  20 mg Intravenous Q24H  . piperacillin-tazobactam (ZOSYN)  IV  3.375 g Intravenous Q8H  . potassium chloride  10 mEq Oral Daily  . simvastatin  40 mg Oral Daily  . [START ON 10/19/2015] vancomycin  750 mg Intravenous Q48H    Continuous Medications:  . sodium chloride 75 mL/hr at 10/18/15 1211  . heparin 850 Units/hr (10/18/15 1104)  . norepinephrine (LEVOPHED) Adult infusion 2 mcg/min (10/18/15 0615)     Electrolyte/Renal Profile and Glucose Profile:   Recent Labs Lab 10/17/15 0940 10/18/15 0125  NA 133* 139  K 4.3 4.2  CL 98* 106  CO2 23 27  BUN 34* 36*  CREATININE 1.30* 1.06*  CALCIUM 8.9 8.0*  GLUCOSE 115* 109*   Protein Profile:   Recent Labs Lab 10/17/15 0940  ALBUMIN 3.5     Nutrition-Focused Physical Exam Findings: deferred at this time   Weight Trend since Admission: Filed Weights   10/17/15 1001 10/17/15  1700  Weight: 87 lb 14.4 oz (39.871 kg) 97 lb (44 kg)   Noted 16% wt loss in the last 2 months per wt encounters   Diet Order:  Diet NPO time specified Except for: Sips with Meds  Skin:   deep tissue injury noted bilateral heels per documentation   Height:   Ht Readings from Last 1 Encounters:  10/17/15  (1.626 m)    Weight:   Wt Readings from Last 1 Encounters:  10/17/15 97 lb (44 kg)    Ideal Body Weight:     BMI:  Body mass index is 16.64 kg/(m^2).  Estimated Nutritional Needs:   Kcal:  BEE 830 kcals (IF 1.2-1.3, AF 1.2) 4098-1191 kcals/d.   Protein:  (1.2-1.5 g/kg) 53-66 g/d  Fluid:  (30-30ml/kg) 1320-1583ml/d  EDUCATION NEEDS:   No education needs identified at this time  HIGH Care Level  Teira Arcilla B. Freida Busman, RD, LDN [phone] (pager) Weekend/On-Call pager [phone])

## 2015-10-18 NOTE — Progress Notes (Signed)
PULMONARY / CRITICAL CARE MEDICINE   Name: ARRON MCNAUGHT MRN: [id number] DOB: [birth year]    ADMISSION DATE:  10/17/2015   HISTORY OF PRESENT ILLNESS:  82 F admitted via ED from SNF with fever, dyspnea, hypotension, severe sepsis and pulmonary infiltrates c/w PNA after episode of vomiting. Symptoms started on evening prior to admission. In the ED she received 1.5 liters NS with some improvement in BP an cognition. She was placed on BiPAP for respiratory distress. PCCM asked to see pt for possible CVL placement for vasopressors. At the time of this evaluation her BP was marginal and became adequate on very low dose norepinephrine. Pt is DNR/DNI  SUBJECTIVE: Blood pressure and mental status much improved.   REVIEW OF SYSTEMS:   Unable to obtain due to altered cognition  VITAL SIGNS: BP 110/49 mmHg  Pulse 91  Temp(Src) 97.7 F (36.5 C) (Oral)  Resp 32  Ht  (1.626 m)  Wt 97 lb (44 kg)  BMI 16.64 kg/m2  SpO2 97%  HEMODYNAMICS:    VENTILATOR SETTINGS:    INTAKE / OUTPUT: I/O last 3 completed shifts: In: 2054 [I.V.:1504; IV Piggyback:550] Out: 350 [Urine:350]  PHYSICAL EXAMINATION: General: Frail, elderly, no respiratory distress  Neuro: RASS 0, + F/C, CNs intact, motor and sensory exam intact, DTRs symmetric HEENT: NCAT, very dry oral mucosa Cardiovascular: NSR, S1/S2, no MRG Lungs: Normal WOB, bilateral breath sounds, diminished in the bases, no wheezes Abdomen: Soft, NT, +BS Ext: cool, no edema  LABS:  BMET  Recent Labs Lab 10/17/15 0940 10/18/15 0125  NA 133* 139  K 4.3 4.2  CL 98* 106  CO2 23 27  BUN 34* 36*  CREATININE 1.30* 1.06*  GLUCOSE 115* 109*    Electrolytes  Recent Labs Lab 10/17/15 0940 10/18/15 0125  CALCIUM 8.9 8.0*    CBC  Recent Labs Lab 10/17/15 0940 10/18/15 0125  WBC 13.4* 13.4*  HGB 11.2* 9.7*  HCT 36.6 31.2*  PLT 248 195    Coag's  Recent Labs Lab 10/17/15 0940 10/17/15 1550  APTT  --  34  INR 1.10  --      Sepsis Markers  Recent Labs Lab 10/17/15 0940 10/17/15 1550  LATICACIDVEN 3.8* 1.5    ABG  Recent Labs Lab 10/17/15 1210  PHART 7.32*  PCO2ART 45  PO2ART 76*    Liver Enzymes  Recent Labs Lab 10/17/15 0940  AST 72*  ALT 31  ALKPHOS 121  BILITOT 0.4  ALBUMIN 3.5    Cardiac Enzymes  Recent Labs Lab 10/17/15 0940 10/17/15 1550 10/17/15 2011  TROPONINI 3.08* 8.23* 6.96*    Glucose No results for input(s): GLUCAP in the last 168 hours.  Dg Chest Port 1 View  10/18/2015  CLINICAL DATA:  82 year old female with respiratory failure. EXAM: PORTABLE CHEST 1 VIEW COMPARISON:  10/17/2015 and prior exams FINDINGS: Cardiomediastinal silhouette is unchanged. 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 Decreased pulmonary vascular congestion noted. Bilateral lower lung atelectasis/consolidation again noted. There is no evidence of pneumothorax. No other changes are identified. IMPRESSION: Decreased pulmonary vascular congestion with continued bilateral lower lung atelectasis/ consolidation. Electronically Signed   By: Harmon Pier M.D.   On: 10/18/2015 08:11    MICRO DATA: MRSA PCR 02/24 >> NEG Flu nasal swab 02/24 >> NEG Blood 02/24 >>  Urine 02/24 >>   ANTIMICROBIALS:  Vanc 02/24 >>  Zosyn 02/24 >>   Significant events 02/24>ED with sob, hypotension and hypoxia 02/24 TTE: LVEF 15%  ASSESSMENT / PLAN:  PULMONARY A:  Acute hypoxic respiratory failure due to PNA Right lower lobe pneumonia Mild pulmonary edema  P:   Supplemental O2  to maintain  SpO2 > 90% Continue prn BiPAP DNI Oral hygiene F/u cultures Empiric antibiotics as above  CARDIOVASCULAR A:  Septic and hypovolemic shock-BP improved NSTEMI - likely stress/demand induced; peak troponin 6.96 Severe LV dysfunction P:  Cardiology following; appreciate recs Wean norepinephrine as able for MAP > No indication for CVL; BP stable with low dose levophed and  Fluids boluses Heparin gtt with aPTT  monitoring Per cardiology, patient is not a candidate for invasive evaluation Limit IV fluids due to severe LV dysfunction  RENAL A:   AKI due to septic shock, hypovolemia-creatinine trending down P:   Monitor BMET intermittently Monitor I/Os Correct electrolytes as indicated Not a candidate for HD of any duration Slow fluid boluses if needed  GASTROINTESTINAL A:   Hiatal hernia with emesis Chronic PPI use P:   SUP: IV PPI NPO until cognition improves Was previously on dysphagia diet Bedside swallow evaluation; if normal, resume pre-hospital dysphagia diet  HEMATOLOGIC A:   Chronic anemia without acute blood loss P:  DVT px: On full strength heparin already Monitor CBC intermittently Transfuse per usual guidelines  INFECTIOUS A:   PNA - suspect aspiration given hx of vomiting and dysphagia P:   Monitor temp, WBC count Micro and abx as above Will modify antibiotics once cultures result  ENDOCRINE A:   Hypothyroidism P:   Monitor glu on chem panels Consider SSI if glu > 180 Continue home dose of synthroid  NEUROLOGIC A:   Acute encephalopathy P:   RASS goal: 0 Monitor mental status    Best Practice: Code Status:  DNR/DNI Diet:NPO GI prophylaxis:  PPI. VTE prophylaxis:  SCD's / already on full strength heparin.    Magdalene S. [facility] ANP-BC Pulmonary and Critical Care Medicine [facility] Pager [phone] 10/18/2015, 1:34 PM   PCCM ATTENDING ATTESTATION:  I have evaluated patient with ANP Luci Bank, reviewed database in its entirety and discussed care plan in detail. In addition, this patient was discussed on multidisciplinary rounds.   She is comfortable. She continues to require very low dose norepi. She has newly diagnosed severe cardiomyopathy with mod-severe AS. Prognosis remains guarded. We have adjusted MP goal to 60 mmHg. If she becomes unacceptably uncomfortable, recommend full comfort measures.    Billy Fischer, MD PCCM  service Mobile [phone] Pager [phone]

## 2015-10-18 NOTE — Clinical Social Work Note (Signed)
To ensure patient has no outstanding psychosopClinical Social Work Assessment  Patient Details  Name: [name] [name] MRN: [id number] Date of Birth: January 22, 1920  Date of referral:  10/18/15               Reason for consult:    To assess patients immediate needs.            Permission sought to share information with:  Facility Sport and exercise psychologist, Family Supports, Other Permission granted to share information::  Yes, Verbal Permission Granted  Name::     Brizeyda Holtmeyer, [place] Daughter Clarie Camey ( [place]) Thompson Grayer PASTOR [phone]  Agency::  [facility] [place]  Relationship::  yes  Contact Information:  Yes  Housing/Transportation Living arrangements for the past 2 months:  [place] of Information:  Patient, Friend/Neighbor Patient Interpreter Needed:  None Criminal Activity/Legal Involvement Pertinent to Current Situation/Hospitalization:  No - Comment as needed Significant Relationships:  Adult Children, [place], Friend Lives with:    other residents Do you feel safe going back to the place where you live?  Yes Need for family participation in patient care:  Yes (Comment)  Care giving concerns: None were discussed and it was explained by patient and her friend she is very happy at [facility].   Social Worker assessment / plan: LCSW met with patient from ICU. She was polite and cooperative and oriented to person, place, situation and time. She was able to answer short question and did struggle with some hearing. She reports good vision and was able to verbalize concerns and needs while CSW present. Once better it is her wish to return to [facility]. She has good family support and good friend who brings pts daughter for visits. She is able to walk using a walker and a small wheelchair, he son Wilber Oliphant also is residing at [facility]. Patient is currently receiving IV antibiotics and 02. Patient has medicare Part A and B and [employer].  Employment status:   Retired Forensic scientist:  Research scientist (physical sciences)) PT Recommendations:  Not assessed at this time Information / Referral to community resources:  [place]  Patient/Family's Response to care:  Good understanding of current housing and patient needs  Patient/Family's Understanding of and Emotional Response to Diagnosis, Current Treatment, and Prognosis:  Good  Emotional Assessment Appearance:  Appears stated age Attitude/Demeanor/Rapport:  Other (Polite and cooperative) Affect (typically observed):  Accepting Orientation:   Person,place, situation, time Alcohol / Substance use:  Never Used Psych involvement (Current and /or in the community):  No (Comment)  Discharge Needs  Concerns to be addressed:  Denies Needs/Concerns at this time Readmission within the last 30 days:  No Current discharge risk:  None Barriers to Discharge:  No Barriers Identified   Joana Reamer, LCSW 10/18/2015, 3:40 PM

## 2015-10-18 NOTE — Progress Notes (Signed)
ANTICOAGULATION CONSULT NOTE - Initial Consult  Pharmacy Consult for heparin drip management  Indication: chest pain/ACS  No Known Allergies  Patient Measurements: Height:  (162.6 cm) Weight: 99 lb (44.906 kg) (admission) IBW/kg (Calculated) : 54.7   Vital Signs: Temp: 97.5 F (36.4 C) (02/25 1942) Temp Source: Oral (02/25 1942) BP: 143/61 mmHg (02/25 1942) Pulse Rate: 102 (02/25 1942)  Labs:  Recent Labs  10/17/15 0940 10/17/15 1550 10/17/15 2011 10/18/15 0125 10/18/15 1002 10/18/15 1922  HGB 11.2*  --   --  9.7*  --   --   HCT 36.6  --   --  31.2*  --   --   PLT 248  --   --  195  --   --   APTT  --  34  --   --   --   --   LABPROT 14.4  --   --   --   --   --   INR 1.10  --   --   --   --   --   HEPARINUNFRC  --   --   --  0.14* 0.29* 0.35  CREATININE 1.30*  --   --  1.06*  --   --   TROPONINI 3.08* 8.23* 6.96*  --   --   --     Estimated Creatinine Clearance: 23.5 mL/min (by C-G formula based on Cr of 1.06).   Medical History: Past Medical History  Diagnosis Date  . Hypertension   . GERD (gastroesophageal reflux disease)   . CHF (congestive heart failure) (HCC)   . CAD (coronary artery disease)   . Cellulitis 03/2015    Medications:  Scheduled:  . aspirin EC  81 mg Oral Daily  . chlorhexidine  15 mL Mouth Rinse BID  . levothyroxine  12.5 mcg Intravenous Daily  . levothyroxine  25 mcg Oral QAC breakfast  . pantoprazole (PROTONIX) IV  20 mg Intravenous Q24H  . piperacillin-tazobactam (ZOSYN)  IV  3.375 g Intravenous Q8H  . potassium chloride  10 mEq Oral Daily  . simvastatin  40 mg Oral Daily  . [START ON 10/19/2015] vancomycin  750 mg Intravenous Q48H   Infusions:  . sodium chloride 75 mL/hr at 10/18/15 1211  . heparin 850 Units/hr (10/18/15 1104)  . norepinephrine (LEVOPHED) Adult infusion 2 mcg/min (10/18/15 9604)    Assessment: Pharmacy consulted for heparin drip management for 82 yo female admitted with elevated troponins.   Goal of  Therapy:  Heparin level 0.3-0.7 units/ml Monitor platelets by anticoagulation protocol: Yes   Plan:  Heparin level at goal 0.35. Continue heparin drip at 850 units/hr. Recheck in 8 hours.  Olene Floss, Pharm.D Clinical Pharmacist  Clinical Pharmacist  10/18/2015,9:19 PM

## 2015-10-19 ENCOUNTER — Inpatient Hospital Stay: Payer: Medicare Other

## 2015-10-19 LAB — URINE CULTURE: CULTURE: NO GROWTH

## 2015-10-19 LAB — EXPECTORATED SPUTUM ASSESSMENT W REFEX TO RESP CULTURE: SPECIAL REQUESTS: NORMAL

## 2015-10-19 LAB — BASIC METABOLIC PANEL
Anion gap: 12 (ref 5–15)
BUN: 33 mg/dL — AB (ref 6–20)
CALCIUM: 8.5 mg/dL — AB (ref 8.9–10.3)
CHLORIDE: 110 mmol/L (ref 101–111)
CO2: 19 mmol/L — AB (ref 22–32)
CREATININE: 0.99 mg/dL (ref 0.44–1.00)
GFR calc non Af Amer: 48 mL/min — ABNORMAL LOW (ref >60)
GFR, EST AFRICAN AMERICAN: 55 mL/min — AB (ref >60)
GLUCOSE: 53 mg/dL — AB (ref 65–99)
Potassium: 3.7 mmol/L (ref 3.5–5.1)
Sodium: 141 mmol/L (ref 135–145)

## 2015-10-19 LAB — CBC
HCT: 29 % — ABNORMAL LOW (ref 35.0–47.0)
Hemoglobin: 9.1 g/dL — ABNORMAL LOW (ref 12.0–16.0)
MCH: 22.5 pg — AB (ref 26.0–34.0)
MCHC: 31.2 g/dL — AB (ref 32.0–36.0)
MCV: 72.1 fL — AB (ref 80.0–100.0)
PLATELETS: 171 10*3/uL (ref 150–440)
RBC: 4.03 MIL/uL (ref 3.80–5.20)
RDW: 21.2 % — AB (ref 11.5–14.5)
WBC: 7.7 10*3/uL (ref 3.6–11.0)

## 2015-10-19 LAB — MAGNESIUM: Magnesium: 1.7 mg/dL (ref 1.7–2.4)

## 2015-10-19 LAB — EXPECTORATED SPUTUM ASSESSMENT W GRAM STAIN, RFLX TO RESP C

## 2015-10-19 LAB — PHOSPHORUS: Phosphorus: 3.6 mg/dL (ref 2.5–4.6)

## 2015-10-19 LAB — HEPARIN LEVEL (UNFRACTIONATED): Heparin Unfractionated: 0.33 IU/mL (ref 0.30–0.70)

## 2015-10-19 MED ORDER — GUAIFENESIN-DM 100-10 MG/5ML PO SYRP
5.0000 mL | ORAL_SOLUTION | ORAL | Status: DC | PRN
  Administered 2015-10-19 – 2015-10-20 (×2): 5 mL via ORAL
  Filled 2015-10-19 (×2): qty 5

## 2015-10-19 NOTE — Progress Notes (Signed)
[facility] NAME: [name] [name]    MR#:  [id number]  DATE OF BIRTH:  08-03-1920  SUBJECTIVE:   Pt. Here due to acute resp. Failure due to pneumonia.  Still has significant upper airway congestion/rattling.  Hemodynamically stable and afebrile.    REVIEW OF SYSTEMS:    Review of Systems  Constitutional: Negative for fever and chills.  HENT: Negative for congestion and tinnitus.   Eyes: Negative for blurred vision and double vision.  Respiratory: Positive for cough and shortness of breath ( improved). Negative for wheezing.   Cardiovascular: Negative for chest pain, orthopnea and PND.  Gastrointestinal: Negative for nausea, vomiting, abdominal pain and diarrhea.  Genitourinary: Negative for dysuria and hematuria.  Neurological: Negative for dizziness, sensory change and focal weakness.  All other systems reviewed and are negative.   Nutrition: NPO await Speech eval.  Tolerating Diet: Await Speech eval.  Tolerating PT: Await Eval.    DRUG ALLERGIES:  No Known Allergies  VITALS:  Blood pressure 152/70, pulse 107, temperature 97.5 F (36.4 C), temperature source Oral, resp. rate 20, height 5' 4"  (1.626 m), weight 44.906 kg (99 lb), SpO2 100 %.  PHYSICAL EXAMINATION:   Physical Exam  GENERAL:  82 y.o.-year-old cachectic patient lying in the bed in no acute distress.  EYES: Pupils equal, round, reactive to light and accommodation. No scleral icterus. Extraocular muscles intact.  HEENT: Head atraumatic, normocephalic. Oropharynx and nasopharynx clear.  NECK:  Supple, no jugular venous distention. No thyroid enlargement, no tenderness.  LUNGS: Poor respiratory effort. Diffuse wheezing and rhonchi bilaterally. Negative use of accessory muscles.+ upper airway congestion.  CARDIOVASCULAR: S1, S2 normal. No murmurs, rubs, or gallops.  ABDOMEN: Soft, nontender, nondistended. Bowel sounds present. No organomegaly or mass.   EXTREMITIES: No cyanosis, clubbing or edema b/l.    NEUROLOGIC: Cranial nerves II through XII are intact. No focal Motor or sensory deficits b/l.  Globally weak. PSYCHIATRIC: The patient is alert and oriented x 3.  SKIN: No obvious rash, lesion, or ulcer.    LABORATORY PANEL:   CBC  Recent Labs Lab 10/19/15 0428  WBC 7.7  HGB 9.1*  HCT 29.0*  PLT 171   ------------------------------------------------------------------------------------------------------------------  Chemistries   Recent Labs Lab 10/17/15 0940  10/19/15 0428  NA 133*  < > 141  K 4.3  < > 3.7  CL 98*  < > 110  CO2 23  < > 19*  GLUCOSE 115*  < > 53*  BUN 34*  < > 33*  CREATININE 1.30*  < > 0.99  CALCIUM 8.9  < > 8.5*  MG  --   --  1.7  AST 72*  --   --   ALT 31  --   --   ALKPHOS 121  --   --   BILITOT 0.4  --   --   < > = values in this interval not displayed. ------------------------------------------------------------------------------------------------------------------  Cardiac Enzymes  Recent Labs Lab 10/17/15 2011  TROPONINI 6.96*   ------------------------------------------------------------------------------------------------------------------  RADIOLOGY:  Dg Chest Port 1 View  10/19/2015  CLINICAL DATA:  Acute respiratory failure EXAM: PORTABLE CHEST 1 VIEW COMPARISON:  10/18/2015 FINDINGS: Prior CABG. Cardiomegaly. Bibasilar and perihilar airspace opacities are again noted, not significantly changed. No visible effusions. IMPRESSION: Stable bilateral perihilar and bibasilar atelectasis or infiltrates. Electronically Signed   By: Rolm Baptise M.D.   On: 10/19/2015 07:42   Dg Chest Port 1 View  10/18/2015  CLINICAL DATA:  82 year old female with respiratory failure. EXAM: PORTABLE CHEST 1 VIEW COMPARISON:  10/17/2015 and prior exams FINDINGS: Cardiomediastinal silhouette is unchanged. 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 Decreased pulmonary vascular congestion noted. Bilateral lower lung  atelectasis/consolidation again noted. There is no evidence of pneumothorax. No other changes are identified. IMPRESSION: Decreased pulmonary vascular congestion with continued bilateral lower lung atelectasis/ consolidation. Electronically Signed   By: Margarette Canada M.D.   On: 10/18/2015 08:11     ASSESSMENT AND PLAN:   82 year old female with past medical history of hypertension, hyperlipidemia, hypothyroidism, GERD, history of CHF who presents to the hospital due to shortness of breath, fever and noted to be septic.  #1 sepsis-patient met criteria on admission given her fever, tachypnea, tachycardia and chest x-ray findings suggestive of a right mid and lower lobe pneumonia. -off vasopressors now. Hemodynamically stable. Continue vancomycin, Zosyn and if cultures (-) will d/c Vanc. Tomorrow.  -Follow blood, sputum cultures which are negative so far.  #2 pneumonia-suspect aspiration pneumonia. -Continue vancomycin, Zosyn for now. Follow sputum and blood cultures which are (-) so far.  -Await speech evaluation prior to initiating oral intake.  #3 acute respiratory failure with hypoxia-secondary to pneumonia. -Continue supportive care with O2 supplementation, IV antibiotics and pulmonary toileting. Still has significant upper airway rattling. Will try deep suctioning if possible.  -Patient is a DO NOT RESUSCITATE.  Improved Since yesterday.  #4 non-ST elevation MI-patients troponin peaked as high as 8. Clinically patient is chest pain-free and hemodynamically stable. -Continue heparin nomogram, aspirin, statin. -Seen by cardiology and continue current care. Echocardiogram shows worsening EF of 20-25%.   -Given her advanced age and comorbidities she is not a candidate for aggressive intervention.  #5 acute renal failure-likely due to ATN secondary to sepsis.  -continue IV fluids and Cr. At baseline now.   #6 hypothyroidism-continue Synthroid.  #7 GERD-continue Protonix    All the  records are reviewed and case discussed with Care Management/Social Workerr. Management plans discussed with the patient, family and they are in agreement.  CODE STATUS: DO NOT RESUSCITATE  DVT Prophylaxis: Heparin drip  TOTAL TIME TAKING CARE OF THIS PATIENT: 30 minutes.   POSSIBLE D/C IN 2-3 DAYS, DEPENDING ON CLINICAL CONDITION.   Henreitta Leber M.D on 10/19/2015 at 3:27 PM  Between 7am to 6pm - Pager - [phone]  After 6pm go to [email or web address] - password [email or web address] [facility]  Office  [phone]  CC: Primary care physician; Marden Noble, MD

## 2015-10-19 NOTE — Progress Notes (Signed)
ANTICOAGULATION CONSULT NOTE - Initial Consult  Pharmacy Consult for heparin drip management  Indication: chest pain/ACS  No Known Allergies  Patient Measurements: Height:  (162.6 cm) Weight: 99 lb (44.906 kg) (admission) IBW/kg (Calculated) : 54.7   Vital Signs: Temp: 97.6 F (36.4 C) (02/26 0422) Temp Source: Oral (02/26 0422) BP: 136/63 mmHg (02/26 0422) Pulse Rate: 109 (02/26 0422)  Labs:  Recent Labs  10/17/15 0940 10/17/15 1550 10/17/15 2011  10/18/15 0125 10/18/15 1002 10/18/15 1922 10/19/15 0428  HGB 11.2*  --   --   --  9.7*  --   --  9.1*  HCT 36.6  --   --   --  31.2*  --   --  29.0*  PLT 248  --   --   --  195  --   --  171  APTT  --  34  --   --   --   --   --   --   LABPROT 14.4  --   --   --   --   --   --   --   INR 1.10  --   --   --   --   --   --   --   HEPARINUNFRC  --   --   --   < > 0.14* 0.29* 0.35 0.33  CREATININE 1.30*  --   --   --  1.06*  --   --  0.99  TROPONINI 3.08* 8.23* 6.96*  --   --   --   --   --   < > = values in this interval not displayed.  Estimated Creatinine Clearance: 25.2 mL/min (by C-G formula based on Cr of 0.99).   Medical History: Past Medical History  Diagnosis Date  . Hypertension   . GERD (gastroesophageal reflux disease)   . CHF (congestive heart failure) (HCC)   . CAD (coronary artery disease)   . Cellulitis 03/2015    Medications:  Scheduled:  . aspirin EC  81 mg Oral Daily  . chlorhexidine  15 mL Mouth Rinse BID  . levothyroxine  12.5 mcg Intravenous Daily  . levothyroxine  25 mcg Oral QAC breakfast  . pantoprazole (PROTONIX) IV  20 mg Intravenous Q24H  . piperacillin-tazobactam (ZOSYN)  IV  3.375 g Intravenous Q8H  . potassium chloride  10 mEq Oral Daily  . simvastatin  40 mg Oral Daily  . vancomycin  750 mg Intravenous Q48H   Infusions:  . sodium chloride 75 mL/hr at 10/19/15 0025  . heparin 850 Units/hr (10/19/15 0025)  . norepinephrine (LEVOPHED) Adult infusion 2 mcg/min (10/18/15 4010)     Assessment: Pharmacy consulted for heparin drip management for 82 yo female admitted with elevated troponins.   Goal of Therapy:  Heparin level 0.3-0.7 units/ml Monitor platelets by anticoagulation protocol: Yes   Plan:  Heparin level at goal 0.35. Continue heparin drip at 850 units/hr. Recheck in 8 hours.  2/26 AM heparin level 0.33. Continue current regimen. Recheck CBC and heparin level tomorrow AM.  Olene Floss, Pharm.D Clinical Pharmacist  Clinical Pharmacist  10/19/2015,5:31 AM

## 2015-10-19 NOTE — Progress Notes (Signed)
No Bipap is in pt's room. Pt is in no distress at this time and doesn't need Bipap as of right now.

## 2015-10-19 NOTE — Plan of Care (Signed)
Problem: Skin Integrity: Goal: Risk for impaired skin integrity will decrease Outcome: Progressing Pt has red areas over bony prominences on back but blanchable. Pt has ulcerations on her toes and heels are boggy. Applied  Non pressure boots and foam to heels.  Problem: Nutrition: Goal: Adequate nutrition will be maintained Pt is currently NPO until speech eval. Pt does have an appetite, requesting food.

## 2015-10-19 NOTE — Progress Notes (Signed)
[facility] Encounter Note  Patient: [name] [name] / Admit Date: 10/17/2015 / Date of Encounter: 10/19/2015, 6:23 AM   Subjective: Patient has been breathing much better since admission. Patient is hemodynamically stable  Review of Systems: Positive for: Shortness of breath Negative for: Vision change, hearing change, syncope, dizziness, nausea, vomiting,diarrhea, bloody stool, stomach pain, cough, congestion, diaphoresis, urinary frequency, urinary pain,skin lesions, skin rashes Others previously listed  Objective: Telemetry: Normal sinus rhythm Physical Exam: Blood pressure 136/63, pulse 109, temperature 97.6 F (36.4 C), temperature source Oral, resp. rate 25, height  (1.626 m), weight 99 lb (44.906 kg), SpO2 99 %. Body mass index is 16.98 kg/(m^2). General: Well developed, poorly nourished, in no acute distress. Head: Normocephalic, atraumatic, sclera non-icteric, no xanthomas, nares are without discharge. Neck: No apparent masses Lungs: Normal respirations with few wheezes, no rhonchi, basilar rales , no crackles   Heart: Regular rate and rhythm, normal S1 soft S2, 2-3+ aortic murmur, no rub, no gallop, PMI is normal size and inferior placement, carotid upstroke normal with bruit, jugular venous pressure normal Abdomen: Soft, non-tender, non-distended with normoactive bowel sounds. No hepatosplenomegaly. Abdominal aorta is normal size without bruit Extremities: Trace edema, no clubbing, no cyanosis, no ulcers,  Peripheral: 2+ radial, 2+ femoral, 2+ dorsal pedal pulses Neuro: Alert and oriented. Moves all extremities spontaneously. Psych:  Responds to questions appropriately with a normal affect.   Intake/Output Summary (Last 24 hours) at 10/19/15 0623 Last data filed at 10/19/15 0551  Gross per 24 hour  Intake 866.25 ml  Output    925 ml  Net -58.75 ml    Inpatient Medications:  . aspirin EC  81 mg Oral Daily  . chlorhexidine  15 mL Mouth Rinse  BID  . levothyroxine  12.5 mcg Intravenous Daily  . levothyroxine  25 mcg Oral QAC breakfast  . pantoprazole (PROTONIX) IV  20 mg Intravenous Q24H  . piperacillin-tazobactam (ZOSYN)  IV  3.375 g Intravenous Q8H  . potassium chloride  10 mEq Oral Daily  . simvastatin  40 mg Oral Daily  . vancomycin  750 mg Intravenous Q48H   Infusions:  . sodium chloride 75 mL/hr at 10/19/15 0025  . heparin 850 Units/hr (10/19/15 0025)  . norepinephrine (LEVOPHED) Adult infusion 2 mcg/min (10/18/15 0615)    Labs:  Recent Labs  10/18/15 0125 10/19/15 0428  NA 139 141  K 4.2 3.7  CL 106 110  CO2 27 19*  GLUCOSE 109* 53*  BUN 36* 33*  CREATININE 1.06* 0.99  CALCIUM 8.0* 8.5*  MG  --  1.7  PHOS  --  3.6    Recent Labs  10/17/15 0940  AST 72*  ALT 31  ALKPHOS 121  BILITOT 0.4  PROT 7.2  ALBUMIN 3.5    Recent Labs  10/17/15 0940 10/18/15 0125 10/19/15 0428  WBC 13.4* 13.4* 7.7  NEUTROABS 12.3*  --   --   HGB 11.2* 9.7* 9.1*  HCT 36.6 31.2* 29.0*  MCV 73.0* 72.0* 72.1*  PLT 248 195 171    Recent Labs  10/17/15 0940 10/17/15 1550 10/17/15 2011  TROPONINI 3.08* 8.23* 6.96*   Invalid input(s): POCBNP No results for input(s): HGBA1C in the last 72 hours.   Weights: Filed Weights   10/17/15 1001 10/17/15 1700 10/18/15 1718  Weight: 87 lb 14.4 oz (39.871 kg) 97 lb (44 kg) 99 lb (44.906 kg)     Radiology/Studies:  Dg Chest Port 1 View  10/18/2015  CLINICAL DATA:  82 year old  female with respiratory failure. EXAM: PORTABLE CHEST 1 VIEW COMPARISON:  10/17/2015 and prior exams FINDINGS: Cardiomediastinal silhouette is unchanged. 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 Decreased pulmonary vascular congestion noted. Bilateral lower lung atelectasis/consolidation again noted. There is no evidence of pneumothorax. No other changes are identified. IMPRESSION: Decreased pulmonary vascular congestion with continued bilateral lower lung atelectasis/ consolidation. Electronically Signed   By:  Harmon Pier M.D.   On: 10/18/2015 08:11   Dg Chest Port 1 View  10/17/2015  CLINICAL DATA:  Progressive cough EXAM: PORTABLE CHEST 1 VIEW COMPARISON:  August 20, 2015 FINDINGS: There is extensive airspace opacity throughout the right mid and lower lung zones. There is patchy atelectasis in the left base. Heart is mildly enlarged with pulmonary vascularity within normal limits. No adenopathy. There is a sizable paraesophageal type hernia. Patient is status post coronary artery bypass grafting. No adenopathy evident. IMPRESSION: Pneumonia in the right mid and lower lung zones. Atelectasis left base. 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 Stable cardiac silhouette. Followup PA and lateral chest radiographs recommended in 3-4 weeks following trial of antibiotic therapy to ensure resolution and exclude underlying malignancy. Electronically Signed   By: Bretta Bang III M.D.   On: 10/17/2015 10:39     Assessment and Recommendation  82 y.o. female with acute on chronic systolic dysfunction congestive heart failure multifactorial in nature including severe LV systolic dysfunction with ejection fraction of 15% by echocardiogram moderate to severe aortic valve stenosis chronic kidney disease stage III and acute non-ST elevation myocardial infarction. 1. Continue Lasix for further concerns of pulmonary edema and acute on chronic systolic dysfunction heart failure watching closely for concerns of chronic kidney disease 2. Continue oxygen supplementation 3. No further cardiac diagnostics at this time necessary 4. Patient has poor long-term prognosis due to severe LV systolic dysfunction and valvular heart disease not able to further intervene other than medication management 5. Possible introduction of low-dose beta blocker if able to help with heart rate control and risk reduction of recurrent heart failure 6. Consideration of palliative care if needed 7. Okay for discharge to home from cardiac standpoint when  the pneumonia is significantly improved with follow-up in one to 2 weeks  Signed, Arnoldo Hooker M.D. FACC

## 2015-10-20 LAB — CBC
HEMATOCRIT: 33.4 % — AB (ref 35.0–47.0)
Hemoglobin: 10.5 g/dL — ABNORMAL LOW (ref 12.0–16.0)
MCH: 23 pg — AB (ref 26.0–34.0)
MCHC: 31.3 g/dL — AB (ref 32.0–36.0)
MCV: 73.4 fL — AB (ref 80.0–100.0)
PLATELETS: 221 10*3/uL (ref 150–440)
RBC: 4.55 MIL/uL (ref 3.80–5.20)
RDW: 20.9 % — AB (ref 11.5–14.5)
WBC: 9.3 10*3/uL (ref 3.6–11.0)

## 2015-10-20 LAB — HEPARIN LEVEL (UNFRACTIONATED): Heparin Unfractionated: 0.3 IU/mL (ref 0.30–0.70)

## 2015-10-20 NOTE — Care Management (Signed)
Patient transferred to 2A from icu 2/25.  Patient is from  [facility] and anticipate return.  CSW is involved

## 2015-10-20 NOTE — Progress Notes (Signed)
[facility] NAME: [name] [name]    MR#:  [id number]  DATE OF BIRTH:  [birth year]  SUBJECTIVE:   Pt. Here due to acute resp. Failure due to pneumonia.  Continues to have some upper airway rattling and junky breath sounds.   REVIEW OF SYSTEMS:    Review of Systems  Constitutional: Negative for fever and chills.  HENT: Negative for congestion and tinnitus.   Eyes: Negative for blurred vision and double vision.  Respiratory: Positive for cough and shortness of breath ( improved). Negative for wheezing.   Cardiovascular: Negative for chest pain, orthopnea and PND.  Gastrointestinal: Negative for nausea, vomiting, abdominal pain and diarrhea.  Genitourinary: Negative for dysuria and hematuria.  Neurological: Negative for dizziness, sensory change and focal weakness.  All other systems reviewed and are negative.   Nutrition: Dysphagia 1 with nectar thick liquids Tolerating Diet: Yes Tolerating PT: Await Eval.    DRUG ALLERGIES:  No Known Allergies  VITALS:  Blood pressure 157/79, pulse 106, temperature 97.5 F (36.4 C), temperature source Oral, resp. rate 20, height 5' 4" (1.626 m), weight 44.906 kg (99 lb), SpO2 100 %.  PHYSICAL EXAMINATION:   Physical Exam  GENERAL:  82 y.o.-year-old cachectic patient lying in the bed in no acute distress.  EYES: Pupils equal, round, reactive to light and accommodation. No scleral icterus. Extraocular muscles intact.  HEENT: Head atraumatic, normocephalic. Oropharynx and nasopharynx clear.  NECK:  Supple, no jugular venous distention. No thyroid enlargement, no tenderness.  LUNGS: good a/e b/l. Diffuse wheezing and rhonchi bilaterally. Negative use of accessory muscles.+ upper airway congestion.  CARDIOVASCULAR: S1, S2 normal. No murmurs, rubs, or gallops.  ABDOMEN: Soft, nontender, nondistended. Bowel sounds present. No organomegaly or mass.  EXTREMITIES: No cyanosis, clubbing or  edema b/l.    NEUROLOGIC: Cranial nerves II through XII are intact. No focal Motor or sensory deficits b/l.  Globally weak. PSYCHIATRIC: The patient is alert and oriented x 3.  SKIN: No obvious rash, lesion, or ulcer.    LABORATORY PANEL:   CBC  Recent Labs Lab 10/20/15 0436  WBC 9.3  HGB 10.5*  HCT 33.4*  PLT 221   ------------------------------------------------------------------------------------------------------------------  Chemistries   Recent Labs Lab 10/17/15 0940  10/19/15 0428  NA 133*  < > 141  K 4.3  < > 3.7  CL 98*  < > 110  CO2 23  < > 19*  GLUCOSE 115*  < > 53*  BUN 34*  < > 33*  CREATININE 1.30*  < > 0.99  CALCIUM 8.9  < > 8.5*  MG  --   --  1.7  AST 72*  --   --   ALT 31  --   --   ALKPHOS 121  --   --   BILITOT 0.4  --   --   < > = values in this interval not displayed. ------------------------------------------------------------------------------------------------------------------  Cardiac Enzymes  Recent Labs Lab 10/17/15 2011  TROPONINI 6.96*   ------------------------------------------------------------------------------------------------------------------  RADIOLOGY:  Dg Chest Port 1 View  10/19/2015  CLINICAL DATA:  Acute respiratory failure EXAM: PORTABLE CHEST 1 VIEW COMPARISON:  10/18/2015 FINDINGS: Prior CABG. Cardiomegaly. Bibasilar and perihilar airspace opacities are again noted, not significantly changed. No visible effusions. IMPRESSION: Stable bilateral perihilar and bibasilar atelectasis or infiltrates. Electronically Signed   By: Rolm Baptise M.D.   On: 10/19/2015 07:42     ASSESSMENT AND PLAN:   82 year old female with past medical history  of hypertension, hyperlipidemia, hypothyroidism, GERD, history of CHF who presents to the hospital due to shortness of breath, fever and noted to be septic.  #1 sepsis-patient met criteria on admission given her fever, tachypnea, tachycardia and chest x-ray findings suggestive of a  right mid and lower lobe pneumonia. -off vasopressors now. Hemodynamically stable. Continue Zosyn and will d/c Vanc as cultures (-) so far. . Tomorrow.  -Follow blood, sputum cultures which are negative so far.  #2 pneumonia-suspect aspiration pneumonia. -Continue Zosyn and d/c Vanc. for now. Follow sputum and blood cultures which are (-) so far.  -Appreciate speech evaluation and started on a dysphagia 1 with nectar thick liquids.  #3 acute respiratory failure with hypoxia-secondary to pneumonia. -Continue supportive care with O2 supplementation, IV antibiotics and pulmonary toileting. Still has significant upper airway rattling. We'll give flutter valve, continue deep suctioning at least twice daily. -Patient was slow to improve  #4 non-ST elevation MI-patients troponin peaked as high as 8. Clinically patient is chest pain-free and hemodynamically stable. -We'll DC heparin drip today. Continue aspirin, statin. -Seen by cardiology and continue current care. Echocardiogram shows worsening EF of 20-25%.   -Given her advanced age and comorbidities she is not a candidate for aggressive intervention.  #5 acute renal failure-likely due to ATN secondary to sepsis.  - cont. IV fluids and will d/c once PO intake improves.   #6 hypothyroidism-continue Synthroid.  #7 GERD-continue Protonix   All the records are reviewed and case discussed with Care Management/Social Workerr. Management plans discussed with the patient, family and they are in agreement.  CODE STATUS: DO NOT RESUSCITATE  DVT Prophylaxis: Heparin drip  TOTAL TIME TAKING CARE OF THIS PATIENT: 30 minutes.   POSSIBLE D/C IN 2-3 DAYS, DEPENDING ON CLINICAL CONDITION.   Henreitta Leber M.D on 10/20/2015 at 3:23 PM  Between 7am to 6pm - Pager - [phone]  After 6pm go to [email or web address] - password [email or web address] [place]  Office  [phone]  CC: Primary care physician; Marden Noble, MD

## 2015-10-20 NOTE — Progress Notes (Signed)
Bipap PRN, not needed at this time. Not in the room.

## 2015-10-20 NOTE — Progress Notes (Signed)
ANTICOAGULATION CONSULT NOTE - Initial Consult  Pharmacy Consult for heparin drip management  Indication: chest pain/ACS  No Known Allergies  Patient Measurements: Height:  (162.6 cm) Weight: 99 lb (44.906 kg) (admission) IBW/kg (Calculated) : 54.7   Vital Signs: Temp: 97.7 F (36.5 C) (02/27 0442) Temp Source: Oral (02/27 0442) BP: 142/67 mmHg (02/27 0442) Pulse Rate: 106 (02/27 0442)  Labs:  Recent Labs  10/17/15 0940 10/17/15 1550 10/17/15 2011 10/18/15 0125  10/18/15 1922 10/19/15 0428 10/20/15 0436  HGB 11.2*  --   --  9.7*  --   --  9.1* 10.5*  HCT 36.6  --   --  31.2*  --   --  29.0* 33.4*  PLT 248  --   --  195  --   --  171 221  APTT  --  34  --   --   --   --   --   --   LABPROT 14.4  --   --   --   --   --   --   --   INR 1.10  --   --   --   --   --   --   --   HEPARINUNFRC  --   --   --  0.14*  < > 0.35 0.33 0.30  CREATININE 1.30*  --   --  1.06*  --   --  0.99  --   TROPONINI 3.08* 8.23* 6.96*  --   --   --   --   --   < > = values in this interval not displayed.  Estimated Creatinine Clearance: 25.2 mL/min (by C-G formula based on Cr of 0.99).   Medical History: Past Medical History  Diagnosis Date  . Hypertension   . GERD (gastroesophageal reflux disease)   . CHF (congestive heart failure) (HCC)   . CAD (coronary artery disease)   . Cellulitis 03/2015    Medications:  Scheduled:  . aspirin EC  81 mg Oral Daily  . chlorhexidine  15 mL Mouth Rinse BID  . levothyroxine  25 mcg Oral QAC breakfast  . pantoprazole (PROTONIX) IV  20 mg Intravenous Q24H  . piperacillin-tazobactam (ZOSYN)  IV  3.375 g Intravenous Q8H  . potassium chloride  10 mEq Oral Daily  . simvastatin  40 mg Oral Daily  . vancomycin  750 mg Intravenous Q48H   Infusions:  . sodium chloride 50 mL/hr at 10/19/15 1900  . heparin 850 Units/hr (10/19/15 1900)  . norepinephrine (LEVOPHED) Adult infusion 2 mcg/min (10/18/15 1610)    Assessment: Pharmacy consulted for  heparin drip management for 82 yo female admitted with elevated troponins.   Goal of Therapy:  Heparin level 0.3-0.7 units/ml Monitor platelets by anticoagulation protocol: Yes   Plan:  Heparin level at goal 0.35. Continue heparin drip at 850 units/hr. Recheck in 8 hours.  2/26 AM heparin level 0.33. Continue current regimen. Recheck CBC and heparin level tomorrow AM.  2/27 AM heparin level 0.30. Continue current regimen. Recheck CBC and heparin level tomorrow AM.   Olene Floss, Pharm.D Clinical Pharmacist  Clinical Pharmacist  10/20/2015,5:38 AM

## 2015-10-20 NOTE — Care Management Important Message (Signed)
Important Message  Patient Details  Name: [name] [name] MRN: [id number] Date of Birth: Dec 07, 1919   Medicare Important Message Given:  Yes    Olegario Messier A Kirat Mezquita 10/20/2015, 10:36 AM

## 2015-10-20 NOTE — Evaluation (Signed)
Clinical/Bedside Swallow Evaluation Patient Details  Name: [name] [name] MRN: [id number] Date of Birth: 1919-11-10  Today's Date: 10/20/2015 Time: SLP Start Time (ACUTE ONLY): 1045 SLP Stop Time (ACUTE ONLY): 1145 SLP Time Calculation (min) (ACUTE ONLY): 60 min  Past Medical History:  Past Medical History  Diagnosis Date  . Hypertension   . GERD (gastroesophageal reflux disease)   . CHF (congestive heart failure) (HCC)   . CAD (coronary artery disease)   . Cellulitis 03/2015   Past Surgical History:  Past Surgical History  Procedure Laterality Date  . Rib fracture surgery    . Coronary artery bypass graft     HPI:  Pt is a 82 y.o. female with a known history of hypertension, GERD, history of CHF, history of coronary artery disease, hypothyroidism who presents to the hospital due to fever and shortness of breath. Patient presents from a skilled nursing facility as she was noted to have a low-grade fever and also noted to be short of breath and noted to be hypoxic with O2 sats in the 70s to 80s. Patient was sent to the ER for further evaluation and noted to be hypotensive with systolic blood pressures in the 70s and also a fever of 100.2. Patient's chest x-ray findings are suggestive of a right-sided pneumonia in the right mid and lower lobes. Patient is being admitted for code sepsis secondary to underlying pneumonia. Patient incidentally was also noted to have an elevated troponin of 3 but denies any acute chest pain. She's also been having some persistent nausea and vomiting now for the past couple days; he denied any abdominal pain, diarrhea. Pt reports that her meds are "in puree" at the NH. She denies any trouble swallowing but says that she does not like the puree foods she gets at the NH; a therapist is working on upgrading her diet per pt's description(green beans, ground meats). Pt states she likes ice cream; and she gets a "supplement shake" at the NH per her report.   Assessment /  Plan / Recommendation Clinical Impression  Pt appears at increased risk for aspiration at this time evidenced by delayed throat clearing/cough w/ trials of thin liquids; belching+ as well. Also noted increased respiratory effort w/ trials - congested cough x1-2 but not apparently related to po trials. Pt exhibited improved toleration for trials of Nectar liquids and purees; no oral phase deficits noted w/ these trials. Pt fed self w/ some assistance. Pt denied any feelings of N/V. Rec. a modified, dysphagia diet of Dys. 1 w/ Nectar liquids at this time sec. to pt's declined medical and respiratory status' overall w/ trials to upgrade liquids then foods when she returns to SNF w/ ST services. Pt may benefit from an objective assessment such as MBSS. Rec. strict aspiration precautions; meds in puree - crushed as able.     Aspiration Risk   (mild-moderate aspiration risk)    Diet Recommendation  Dys. 1 w/ Nectar liquids; strict aspiration and Reflux precautions. Feeding assistance at meals; monitoring.  Medication Administration: Crushed with puree    Other  Recommendations Recommended Consults:  (Dietician) Oral Care Recommendations: Oral care BID;Staff/trained caregiver to provide oral care Other Recommendations: Order thickener from pharmacy;Prohibited food (jello, ice cream, thin soups);Remove water pitcher   Follow up Recommendations  Skilled Nursing facility (TBD)    Frequency and Duration min 3x week  2 weeks       Prognosis Prognosis for Safe Diet Advancement: Fair      Swallow Study  General Date of Onset: 10/17/15 HPI: Pt is a 81 y.o. female with a known history of hypertension, GERD, history of CHF, history of coronary artery disease, hypothyroidism who presents to the hospital due to fever and shortness of breath. Patient presents from a skilled nursing facility as she was noted to have a low-grade fever and also noted to be short of breath and noted to be hypoxic with O2 sats  in the 70s to 80s. Patient was sent to the ER for further evaluation and noted to be hypotensive with systolic blood pressures in the 70s and also a fever of 100.2. Patient's chest x-ray findings are suggestive of a right-sided pneumonia in the right mid and lower lobes. Patient is being admitted for code sepsis secondary to underlying pneumonia. Patient incidentally was also noted to have an elevated troponin of 3 but denies any acute chest pain. She's also been having some persistent nausea and vomiting now for the past couple days; he denied any abdominal pain, diarrhea. Pt reports that her meds are "in puree" at the NH. She denies any trouble swallowing but says that she does not like the puree foods she gets at the NH; a therapist is working on upgrading her diet per pt's description(green beans, ground meats). Pt states she likes ice cream; and she gets a "supplement shake" at the NH per her report. Type of Study: Bedside Swallow Evaluation Previous Swallow Assessment: none indicated Diet Prior to this Study: Dysphagia 1 (puree);Thin liquids Temperature Spikes Noted: No (wbc 9.3; CXR revealed R lung pna, Paraesophageal hernia L) Respiratory Status: Nasal cannula (3 liters) History of Recent Intubation: No Behavior/Cognition: Alert;Cooperative;Pleasant mood;Requires cueing;Distractible Oral Cavity Assessment: Dry Oral Care Completed by SLP: Yes Oral Cavity - Dentition: Poor condition;Missing dentition Vision: Functional for self-feeding (required assistance) Self-Feeding Abilities: Needs assist;Needs set up;Total assist Patient Positioning: Upright in bed Baseline Vocal Quality: Normal;Low vocal intensity Volitional Cough:  (mildly weak) Volitional Swallow: Able to elicit    Oral/Motor/Sensory Function Overall Oral Motor/Sensory Function: Within functional limits   Ice Chips Ice chips: Within functional limits Presentation: Spoon (fed; 3 trials)   Thin Liquid Thin Liquid:  Impaired Presentation: Cup;Self Fed (6 trials) Oral Phase Impairments:  (wfl) Oral Phase Functional Implications:  (wfl) Pharyngeal  Phase Impairments: Throat Clearing - Delayed (x2; audible swallows) Other Comments: belching noted as well prior to the delayed cough/throat clearing    Nectar Thick Nectar Thick Liquid: Within functional limits Presentation: Spoon;Self Fed (4 trials) Other Comments: no belching noted; no overt s/s of aspiration   Honey Thick Honey Thick Liquid: Not tested   Puree Puree: Within functional limits Presentation: Self Fed;Spoon (assisted; 6-7 trials)   Solid   GO   Solid: Not tested Other Comments: baseline puree w/ trials during tx at NH/Rehab         Jerilynn Som, MS, CCC-SLP  Watson,Katherine 10/20/2015,2:25 PM

## 2015-10-20 NOTE — Progress Notes (Signed)
CSW spoke to [facility]- Admissions coordinator with [facility] to inform her that patient is on 2A. She reports that patient can return to [facility] at discharge. CSW will continue to follow and assist.  Woodroe Mode, MSW, LCSW-A Clinical Social Work Department [phone]

## 2015-10-20 NOTE — Progress Notes (Signed)
Respiratory notified. Pt has order for nasotracheal suctioning and a flutter valve. Respiratory acknowledged and will see patient soon. I will continue to assess.

## 2015-10-21 LAB — CBC
HCT: 31.4 % — ABNORMAL LOW (ref 35.0–47.0)
Hemoglobin: 9.9 g/dL — ABNORMAL LOW (ref 12.0–16.0)
MCH: 22.5 pg — ABNORMAL LOW (ref 26.0–34.0)
MCHC: 31.4 g/dL — AB (ref 32.0–36.0)
MCV: 71.6 fL — ABNORMAL LOW (ref 80.0–100.0)
PLATELETS: 219 10*3/uL (ref 150–440)
RBC: 4.39 MIL/uL (ref 3.80–5.20)
RDW: 21.1 % — AB (ref 11.5–14.5)
WBC: 5.5 10*3/uL (ref 3.6–11.0)

## 2015-10-21 LAB — HEPARIN LEVEL (UNFRACTIONATED)

## 2015-10-21 LAB — CREATININE, SERUM
Creatinine, Ser: 0.83 mg/dL (ref 0.44–1.00)
GFR, EST NON AFRICAN AMERICAN: 59 mL/min — AB (ref >60)

## 2015-10-21 MED ORDER — ENOXAPARIN SODIUM 30 MG/0.3ML ~~LOC~~ SOLN
30.0000 mg | SUBCUTANEOUS | Status: DC
  Administered 2015-10-21 – 2015-10-23 (×3): 30 mg via SUBCUTANEOUS
  Filled 2015-10-21 (×3): qty 0.3

## 2015-10-21 MED ORDER — ENSURE ENLIVE PO LIQD
237.0000 mL | Freq: Two times a day (BID) | ORAL | Status: DC
  Administered 2015-10-21: 237 mL via ORAL

## 2015-10-21 NOTE — Progress Notes (Signed)
Assessed pt for NTS, not needed at this time. Encouraged pt to cough and she did several times, small amount of secretions suctioned from back of throat.

## 2015-10-21 NOTE — Progress Notes (Signed)
Nutrition Follow-up  DOCUMENTATION CODES:   Severe malnutrition in context of chronic illness  INTERVENTION:   Meals and Snacks: Cater to patient preferences; pt reports she likes bread (dinner rolls with evening meals, sandwiches, corn bread, cheese toast for breakfast), also likes boiled eggs;  current diet order does not allow this. Discussed with Belenda Cruise SLP, SLP planning to follow-up with pt tomorrow to re-evaluate for possible diet advancement if feasible. Continue to assess Medical Food Supplement Therapy: recommend continuing Magic Cup, add Ensure BID  NUTRITION DIAGNOSIS:   Inadequate oral intake related to acute illness as evidenced by NPO status.  GOAL:   Patient will meet greater than or equal to 90% of their needs  MONITOR:    (Energy intake)  REASON FOR ASSESSMENT:    (pressure ulcer noted)    ASSESSMENT:    Pt on 4L [place], alert, talkative on visit today   Diet Order:  DIET - DYS 1 Room service appropriate?: Yes with Assist; Fluid consistency:: Nectar Thick   Energy Intake: pt eating bites/sips to 25% of meals, appetite is poor; pt reports she does not like the puree diet, does not taste good. Pt on thin liquids, regular/dysphagia III diet previously. Pt reports she likes the "ice cream"  Food and Nutrition Related history: pt reports she has been at [facility] since end of December, pt does not really like the food there. Appetite has been down. Pt met criteria for severe malnutrition in context of acute illness on admission December 2016   Recent Labs Lab 10/17/15 0940 10/18/15 0125 10/19/15 0428 10/21/15 0451  NA 133* 139 141  --   K 4.3 4.2 3.7  --   CL 98* 106 110  --   CO2 23 27 19*  --   BUN 34* 36* 33*  --   CREATININE 1.30* 1.06* 0.99 0.83  CALCIUM 8.9 8.0* 8.5*  --   MG  --   --  1.7  --   PHOS  --   --  3.6  --   GLUCOSE 115* 109* 53*  --     Glucose Profile: No results for input(s): GLUCAP in the last 72 hours. Meds:  reviewed  Nutrition Focused Physical Exam: Nutrition-Focused physical exam completed. Findings are mild to moderate fat depletion, mild to severe muscle depletion, and no edema.   Height:   Ht Readings from Last 1 Encounters:  10/17/15 _0  (1.626 m)    Weight: 14.7% wt loss in <3 months  Wt Readings from Last 1 Encounters:  10/18/15 99 lb (44.906 kg)   Wt Readings from Last 10 Encounters:  10/18/15 99 lb (44.906 kg)  08/21/15 116 lb 8 oz (52.844 kg)  08/10/15 118 lb (53.524 kg)  05/26/15 113 lb (51.256 kg)  04/25/15 113 lb (51.256 kg)  04/14/15 108 lb (48.988 kg)    BMI:  Body mass index is 16.98 kg/(m^2).  Estimated Nutritional Needs:   Kcal:  BEE 830 kcals (IF 1.2-1.3, AF 1.2) 5258-9483 kcals/d.   Protein:  (1.2-1.5 g/kg) 53-66 g/d  Fluid:  (30-60m/kg) 1320-15434md  EDUCATION NEEDS:   No education needs identified at this time  HIStoneboroRD, LDN ([phone]  ([phone]/On-Call Pager

## 2015-10-21 NOTE — Progress Notes (Signed)
Pharmacy Antibiotic Note  [name] [name] is a 82 y.o. female admitted on 10/17/2015 with sepsis.  Pharmacy has been consulted for vancomycin and Zosyn dosing. Patient received vancomycin 1g in ED on 2/24.   Plan: Will continue vancomycin  IV Q48hr for goal trough of 15-20. Will obtain trough as clinically indicated. Will adjust Zosyn dosing to 3.375 g EI q 8 hours.    Scr 0.99  Crcl 25 ml/min     Height:  (162.6 cm) Weight: 99 lb (44.906 kg) (admission) IBW/kg (Calculated) : 54.7  Temp (24hrs), Avg:97.7 F (36.5 C), Min:97.5 F (36.4 C), Max:98 F (36.7 C)   Recent Labs Lab 10/17/15 0940 10/17/15 1550 10/18/15 0125 10/19/15 0428 10/20/15 0436 10/21/15 0451  WBC 13.4*  --  13.4* 7.7 9.3 5.5  CREATININE 1.30*  --  1.06* 0.99  --   --   LATICACIDVEN 3.8* 1.5  --   --   --   --     Estimated Creatinine Clearance: 25.2 mL/min (by C-G formula based on Cr of 0.99).    No Known Allergies  Antimicrobials this admission: Zosyn 2/24 >>  Vancomycin 2/24 >>   Dose adjustments this admission: N/A  Microbiology results: 2/24 BCx x 2:NGTD 2/24 YQM:VHQI 2/24 MRSA PCR: negative 2/24 Influenza: negative Sputum cx NL flora  Pharmacy will continue to monitor and adjust per protocol.    Mikias Lanz A 10/21/2015 12:33 PM

## 2015-10-21 NOTE — Progress Notes (Signed)
. [place] [facility] [place] NAME: [name] [name]    MR#:  [id number]  DATE OF BIRTH:  [birth year]  SUBJECTIVE:   Pt. Here due to acute resp. Failure due to pneumonia.  Continues to have some upper airway rattling.  Started on dysphagia 1 diet but not eating much.  Still quite weak.   REVIEW OF SYSTEMS:    Review of Systems  Constitutional: Negative for fever and chills.  HENT: Negative for congestion and tinnitus.   Eyes: Negative for blurred vision and double vision.  Respiratory: Positive for cough and shortness of breath ( improved). Negative for wheezing.   Cardiovascular: Negative for chest pain, orthopnea and PND.  Gastrointestinal: Negative for nausea, vomiting, abdominal pain and diarrhea.  Genitourinary: Negative for dysuria and hematuria.  Neurological: Negative for dizziness, sensory change and focal weakness.  All other systems reviewed and are negative.   Nutrition: Dysphagia 1 with nectar thick liquids Tolerating Diet: Yes but little.  Tolerating PT: Await Eval.    DRUG ALLERGIES:  No Known Allergies  VITALS:  Blood pressure 164/77, pulse 118, temperature 97.3 F (36.3 C), temperature source Oral, resp. rate 20, height 5' 4"  (1.626 m), weight 44.906 kg (99 lb), SpO2 96 %.  PHYSICAL EXAMINATION:   Physical Exam  GENERAL:  82 y.o.-year-old cachectic patient lying in the bed in no acute distress.  EYES: Pupils equal, round, reactive to light and accommodation. No scleral icterus. Extraocular muscles intact.  HEENT: Head atraumatic, normocephalic. Oropharynx and nasopharynx clear.  NECK:  Supple, no jugular venous distention. No thyroid enlargement, no tenderness.  LUNGS: good a/e b/l. Diffuse rhonchi bilaterally. Negative use of accessory muscles.+ upper airway congestion.  CARDIOVASCULAR: S1, S2 normal. No murmurs, rubs, or gallops.  ABDOMEN: Soft, nontender, nondistended. Bowel sounds present. No organomegaly or  mass.  EXTREMITIES: No cyanosis, clubbing or edema b/l.    NEUROLOGIC: Cranial nerves II through XII are intact. No focal Motor or sensory deficits b/l.  Globally weak. PSYCHIATRIC: The patient is alert and oriented x 3.  SKIN: No obvious rash, lesion, or ulcer.    LABORATORY PANEL:   CBC  Recent Labs Lab 10/21/15 0451  WBC 5.5  HGB 9.9*  HCT 31.4*  PLT 219   ------------------------------------------------------------------------------------------------------------------  Chemistries   Recent Labs Lab 10/17/15 0940  10/19/15 0428 10/21/15 0451  NA 133*  < > 141  --   K 4.3  < > 3.7  --   CL 98*  < > 110  --   CO2 23  < > 19*  --   GLUCOSE 115*  < > 53*  --   BUN 34*  < > 33*  --   CREATININE 1.30*  < > 0.99 0.83  CALCIUM 8.9  < > 8.5*  --   MG  --   --  1.7  --   AST 72*  --   --   --   ALT 31  --   --   --   ALKPHOS 121  --   --   --   BILITOT 0.4  --   --   --   < > = values in this interval not displayed. ------------------------------------------------------------------------------------------------------------------  Cardiac Enzymes  Recent Labs Lab 10/17/15 2011  TROPONINI 6.96*   ------------------------------------------------------------------------------------------------------------------  RADIOLOGY:  No results found.   ASSESSMENT AND PLAN:   82 year old female with past medical history of hypertension, hyperlipidemia, hypothyroidism, GERD, history of CHF who presents to  the hospital due to shortness of breath, fever and noted to be septic.  #1 sepsis-patient met criteria on admission given her fever, tachypnea, tachycardia and chest x-ray findings suggestive of a right mid and lower lobe pneumonia. -off vasopressors and Hemodynamically stable. Continue Zosyn and will d/c Vanc as cultures (-) so far.  #2 pneumonia-suspect aspiration pneumonia. -Continue Zosyn and d/c Vanc. for now. Follow sputum and blood cultures which are (-) so far.   -Appreciate speech evaluation and started on a dysphagia 1 with nectar thick liquids.  #3 acute respiratory failure with hypoxia-secondary to pneumonia. -Continue supportive care with O2 supplementation, IV antibiotics and pulmonary toileting. Still has significant upper airway rattling. Continue flutter valve, will add chest physiotherapy and aggressive suctioning.  -Patient is slow to improve  #4 non-ST elevation MI-patients troponin peaked as high as 8. Clinically patient is chest pain-free and hemodynamically stable. -We'll DC heparin drip today. Continue aspirin, statin. -Seen by cardiology and continue current care. Echocardiogram shows worsening EF of 20-25%.   -Given her advanced age and comorbidities she is not a candidate for aggressive intervention.  #5 acute renal failure-likely due to ATN secondary to sepsis.  - Cr. Back to baseline. D/c IV fluids.  Encourage PO intake.  Follow BUN/Cr.   #6 hypothyroidism-continue Synthroid.  #7 GERD-continue Protonix.  Await PT eval but pt. Will likely need SNF.   All the records are reviewed and case discussed with Care Management/Social Workerr. Management plans discussed with the patient, family and they are in agreement.  CODE STATUS: DO NOT RESUSCITATE  DVT Prophylaxis: Lovenox  TOTAL TIME TAKING CARE OF THIS PATIENT: 30 minutes.   POSSIBLE D/C IN 2-3 DAYS, DEPENDING ON CLINICAL CONDITION.   Henreitta Leber M.D on 10/21/2015 at 3:28 PM  Between 7am to 6pm - Pager - [phone]  After 6pm go to [email or web address] - password [email or web address] [facility]  Office  [phone]  CC: Primary care physician; Marden Noble, MD

## 2015-10-21 NOTE — Progress Notes (Signed)
MD Dr. Sheryle Hail notified of consistently elevated blood pressures. No new orders given. RN will continue to monitor. Syliva Overman, RN

## 2015-10-21 NOTE — Care Management (Signed)
patient diet will be upgraded 3/1.  02 requirements currently at 4 liters.   Heart rate remains elevated at times.  WBC not elevated.  Afebrile.

## 2015-10-22 ENCOUNTER — Inpatient Hospital Stay: Payer: Medicare Other

## 2015-10-22 LAB — CULTURE, BLOOD (ROUTINE X 2)
CULTURE: NO GROWTH
CULTURE: NO GROWTH

## 2015-10-22 LAB — BASIC METABOLIC PANEL
ANION GAP: 6 (ref 5–15)
BUN: 13 mg/dL (ref 6–20)
CHLORIDE: 110 mmol/L (ref 101–111)
CO2: 29 mmol/L (ref 22–32)
Calcium: 8.8 mg/dL — ABNORMAL LOW (ref 8.9–10.3)
Creatinine, Ser: 0.66 mg/dL (ref 0.44–1.00)
GFR calc Af Amer: >60 mL/min (ref >60)
GFR calc non Af Amer: >60 mL/min (ref >60)
GLUCOSE: 104 mg/dL — AB (ref 65–99)
POTASSIUM: 3 mmol/L — AB (ref 3.5–5.1)
Sodium: 145 mmol/L (ref 135–145)

## 2015-10-22 LAB — CBC
HEMATOCRIT: 31.4 % — AB (ref 35.0–47.0)
HEMOGLOBIN: 10 g/dL — AB (ref 12.0–16.0)
MCH: 22.5 pg — AB (ref 26.0–34.0)
MCHC: 31.9 g/dL — ABNORMAL LOW (ref 32.0–36.0)
MCV: 70.7 fL — AB (ref 80.0–100.0)
PLATELETS: 228 10*3/uL (ref 150–440)
RBC: 4.44 MIL/uL (ref 3.80–5.20)
RDW: 21.2 % — ABNORMAL HIGH (ref 11.5–14.5)
WBC: 4.9 10*3/uL (ref 3.6–11.0)

## 2015-10-22 MED ORDER — PANTOPRAZOLE SODIUM 40 MG PO TBEC
40.0000 mg | DELAYED_RELEASE_TABLET | Freq: Every day | ORAL | Status: DC
  Administered 2015-10-23 – 2015-10-24 (×2): 40 mg via ORAL
  Filled 2015-10-22 (×2): qty 1

## 2015-10-22 MED ORDER — POTASSIUM CHLORIDE CRYS ER 20 MEQ PO TBCR
40.0000 meq | EXTENDED_RELEASE_TABLET | Freq: Once | ORAL | Status: AC
Start: 2015-10-22 — End: 2015-10-22
  Administered 2015-10-22: 40 meq via ORAL
  Filled 2015-10-22: qty 2

## 2015-10-22 MED ORDER — LISINOPRIL 20 MG PO TABS
20.0000 mg | ORAL_TABLET | Freq: Every day | ORAL | Status: DC
  Administered 2015-10-22 – 2015-10-24 (×3): 20 mg via ORAL
  Filled 2015-10-22 (×3): qty 1

## 2015-10-22 MED ORDER — SODIUM CHLORIDE 0.9% FLUSH
3.0000 mL | INTRAVENOUS | Status: DC | PRN
  Administered 2015-10-22 – 2015-10-23 (×2): 3 mL via INTRAVENOUS
  Filled 2015-10-22: qty 3

## 2015-10-22 MED ORDER — CARVEDILOL 3.125 MG PO TABS
3.1250 mg | ORAL_TABLET | Freq: Two times a day (BID) | ORAL | Status: DC
  Administered 2015-10-22 – 2015-10-23 (×2): 3.125 mg via ORAL
  Filled 2015-10-22 (×2): qty 1

## 2015-10-22 MED ORDER — PIPERACILLIN-TAZOBACTAM 3.375 G IVPB
3.3750 g | Freq: Three times a day (TID) | INTRAVENOUS | Status: AC
  Administered 2015-10-22 – 2015-10-23 (×4): 3.375 g via INTRAVENOUS
  Filled 2015-10-22 (×4): qty 50

## 2015-10-22 MED ORDER — PANTOPRAZOLE SODIUM 20 MG PO TBEC: 20.0000 mg | DELAYED_RELEASE_TABLET | Freq: Every day | ORAL | Status: DC

## 2015-10-22 MED ORDER — QUINAPRIL HCL 10 MG PO TABS: 20.0000 mg | ORAL_TABLET | Freq: Every day | ORAL | Status: DC

## 2015-10-22 NOTE — Evaluation (Signed)
Physical Therapy Evaluation Patient Details Name: [name] [name] MRN: [id number] DOB: 1919-09-26 Today's Date: 10/22/2015   History of Present Illness  Patient presents with fever, shortness of breath, elevated troponins indicative of NSTEMI, O2 sats in 70s-80s, found to have R sided pneumonia.   Clinical Impression  Patient has recently been able to ambulate short distances while at rehab with chair follow, however in this session she has difficulty maintaining her balance in standing due to kyphotic posture as well as marked LE weakness. Patient is able to transfer in and out of bed with minimal assistance and demonstrates good sitting balance. She is able to participate in closed chain LE strengthening exercises, though unable to complete any in standing as her feet slide even with grippy socks donned. Patient appears to have made a functional decline from her status at rehab, and would likely continue to benefit from SNF placement at discharge to increase her mobility independence.     Follow Up Recommendations SNF    Equipment Recommendations  Rolling walker with 5" wheels    Recommendations for Other Services       Precautions / Restrictions Precautions Precautions: Fall Restrictions Weight Bearing Restrictions: No      Mobility  Bed Mobility Overal bed mobility: Needs Assistance Bed Mobility: Supine to Sit;Sit to Supine     Supine to sit: Min assist Sit to supine: Min assist   General bed mobility comments: Minimal assistance required for LE transfers and trunk stability throughout bed mobility, otherwise no deficits identified.   Transfers Overall transfer level: Needs assistance Equipment used: Rolling walker (2 wheeled) Transfers: Sit to/from Stand Sit to Stand: Mod assist;Max assist         General transfer comment: Patient attempts sit to stand x 3, unable to keep her LEs underneath her hips, begins to slide posteriorly all 3 attempts.   Ambulation/Gait                Stairs            Wheelchair Mobility    Modified Rankin (Stroke Patients Only)       Balance Overall balance assessment: Needs assistance Sitting-balance support: Bilateral upper extremity supported Sitting balance-Leahy Scale: Good     Standing balance support: Bilateral upper extremity supported Standing balance-Leahy Scale: Zero                               Pertinent Vitals/Pain Pain Assessment:  (Reports her usual back pain)    Home Living Family/patient expects to be discharged to:: Skilled nursing facility                      Prior Function Level of Independence: Independent with assistive device(s);Needs assistance   Gait / Transfers Assistance Needed: Patient reports she was ambulating with PT assist and RW with wheelchair follow at [facility] prior to this admission.            Hand Dominance        Extremity/Trunk Assessment   Upper Extremity Assessment: Generalized weakness           Lower Extremity Assessment: Generalized weakness (No focal weakness, though unable to extend LEs in attempts at standing,)      Cervical / Trunk Assessment: Kyphotic (Kyphotic throughout axial spine)  Communication   Communication: HOH  Cognition Arousal/Alertness: Awake/alert Behavior During Therapy: WFL for tasks assessed/performed Overall Cognitive Status: History of cognitive  impairments - at baseline (Appears to have age appropriate cognitive deficits.)                      General Comments      Exercises Other Exercises Other Exercises: Supine bridging x 10 repetitions (through available range), LAQs x 10 bilaterally, scooting to L side of bed attempted x 5 to increase closed chain LE strength       Assessment/Plan    PT Assessment Patient needs continued PT services  PT Diagnosis Difficulty walking;Generalized weakness   PT Problem List Decreased balance;Decreased activity  tolerance;Cardiopulmonary status limiting activity;Decreased knowledge of use of DME;Decreased strength  PT Treatment Interventions Gait training;DME instruction;Therapeutic activities;Therapeutic exercise;Balance training   PT Goals (Current goals can be found in the Care Plan section) Acute Rehab PT Goals Patient Stated Goal: To increase her mobility.  PT Goal Formulation: With patient Time For Goal Achievement: 25-Nov-2015 Potential to Achieve Goals: Good    Frequency Min 2X/week   Barriers to discharge        Co-evaluation               End of Session Equipment Utilized During Treatment: Gait belt Activity Tolerance: Patient tolerated treatment well;Patient limited by lethargy Patient left: in bed;with call bell/phone within reach;with bed alarm set Nurse Communication: Mobility status (Assistance with feeding)         Time: 4098-1191 PT Time Calculation (min) (ACUTE ONLY): 25 min   Charges:   PT Evaluation $PT Eval Moderate Complexity: 1 Procedure PT Treatments $Therapeutic Exercise: 8-22 mins   PT G Codes:       Kerin Ransom, PT, DPT    10/22/2015, 6:36 PM

## 2015-10-22 NOTE — Care Management Important Message (Signed)
Important Message  Patient Details  Name: GRACELYNN BIRCHER MRN: [id number] Date of Birth: 02/17/1920   Medicare Important Message Given:  Yes    Olegario Messier A Heraclio Seidman 10/22/2015, 10:29 AM

## 2015-10-22 NOTE — Progress Notes (Signed)
Per Sainani ok to order PT

## 2015-10-22 NOTE — Progress Notes (Signed)
Per MD Sainani ok to change protonix to PO

## 2015-10-22 NOTE — Progress Notes (Signed)
. [facility] [place] NAME: [name] [name]    MR#:  [id number]  DATE OF BIRTH:  August 09, 1920  SUBJECTIVE:   Pt. Here due to acute resp. Failure due to pneumonia.  Upper airway rattling and shortness of breath much improved. had modified barium swallow today and diet advanced to dysphagia 2.   REVIEW OF SYSTEMS:    Review of Systems  Constitutional: Negative for fever and chills.  HENT: Negative for congestion and tinnitus.   Eyes: Negative for blurred vision and double vision.  Respiratory: Positive for cough and shortness of breath ( improved). Negative for wheezing.   Cardiovascular: Negative for chest pain, orthopnea and PND.  Gastrointestinal: Negative for nausea, vomiting, abdominal pain and diarrhea.  Genitourinary: Negative for dysuria and hematuria.  Neurological: Negative for dizziness, sensory change and focal weakness.  All other systems reviewed and are negative.   Nutrition: Dysphagia 1 with nectar thick liquids Tolerating Diet: Yes but little.  Tolerating PT: Await Eval.    DRUG ALLERGIES:  No Known Allergies  VITALS:  Blood pressure 141/69, pulse 110, temperature 97.8 F (36.6 C), temperature source Oral, resp. rate 17, height _0  (1.626 m), weight 44.906 kg (99 lb), SpO2 98 %.  PHYSICAL EXAMINATION:   Physical Exam  GENERAL:  82 y.o.-year-old cachectic patient lying in the bed in no acute distress.  EYES: Pupils equal, round, reactive to light and accommodation. No scleral icterus. Extraocular muscles intact.  HEENT: Head atraumatic, normocephalic. Oropharynx and nasopharynx clear.  NECK:  Supple, no jugular venous distention. No thyroid enlargement, no tenderness.  LUNGS: good a/e b/l. Diffuse rhonchi bilaterally. Negative use of accessory muscles.+ upper airway congestion.  CARDIOVASCULAR: S1, S2 normal. No murmurs, rubs, or gallops.  ABDOMEN: Soft, nontender, nondistended. Bowel sounds present. No  organomegaly or mass.  EXTREMITIES: No cyanosis, clubbing or edema b/l.    NEUROLOGIC: Cranial nerves II through XII are intact. No focal Motor or sensory deficits b/l.  Globally weak. PSYCHIATRIC: The patient is alert and oriented x 3.  SKIN: No obvious rash, lesion, or ulcer.    LABORATORY PANEL:   CBC  Recent Labs Lab 10/22/15 0456  WBC 4.9  HGB 10.0*  HCT 31.4*  PLT 228   ------------------------------------------------------------------------------------------------------------------  Chemistries   Recent Labs Lab 10/17/15 0940  10/19/15 0428  10/22/15 0456  NA 133*  < > 141  --  145  K 4.3  < > 3.7  --  3.0*  CL 98*  < > 110  --  110  CO2 23  < > 19*  --  29  GLUCOSE 115*  < > 53*  --  104*  BUN 34*  < > 33*  --  13  CREATININE 1.30*  < > 0.99  < > 0.66  CALCIUM 8.9  < > 8.5*  --  8.8*  MG  --   --  1.7  --   --   AST 72*  --   --   --   --   ALT 31  --   --   --   --   ALKPHOS 121  --   --   --   --   BILITOT 0.4  --   --   --   --   < > = values in this interval not displayed. ------------------------------------------------------------------------------------------------------------------  Cardiac Enzymes  Recent Labs Lab 10/17/15 2011  TROPONINI 6.96*   ------------------------------------------------------------------------------------------------------------------  RADIOLOGY:  No results  found.   ASSESSMENT AND PLAN:   82 year old female with past medical history of hypertension, hyperlipidemia, hypothyroidism, GERD, history of CHF who presents to the hospital due to shortness of breath, fever and noted to be septic.  #1 sepsis-patient met criteria on admission given her fever, tachypnea, tachycardia and chest x-ray findings suggestive of a right mid and lower lobe pneumonia. -off vasopressors and Hemodynamically stable. Continue Zosyn X 7 days and off Vanc now. - cultures (-) so far.  #2 pneumonia-suspect aspiration pneumonia. -Continue  Zosyn X 7 days. Follow sputum and blood cultures which are (-) so far.  -Appreciate speech evaluation And had modified barium swallow today. Diet advanced to dysphagia 2 with honey thick liquids.  #3 acute respiratory failure with hypoxia-secondary to pneumonia. -Continue supportive care with O2 supplementation, IV antibiotics and pulmonary toileting. Still has significant upper airway rattling. Continue flutter valve, chest physiotherapy and aggressive suctioning.  -Patient is much improved since yesterday.  #4 non-ST elevation MI-patients troponin peaked as high as 8. Clinically patient is chest pain-free and hemodynamically stable. -off heparin gtt now.  Cont. ASA, will start low dose Coreg, cont. Statin and ACE.  -Seen by cardiology and continue current care. Echocardiogram shows worsening EF of 20-25%.   -Given her advanced age and comorbidities she is not a candidate for aggressive intervention.  #5 acute renal failure-likely due to ATN secondary to sepsis.  - Cr. Back to baseline. Off IV fluids.  Encourage PO intake.  Follow BUN/Cr.   #6 hypothyroidism-continue Synthroid.  #7 GERD-continue Protonix.  PT eval but pt. Is a long term care patient at [facility] the records are reviewed and case discussed with Care Management/Social Workerr. Management plans discussed with the patient, family and they are in agreement.  CODE STATUS: DO NOT RESUSCITATE  DVT Prophylaxis: Lovenox  TOTAL TIME TAKING CARE OF THIS PATIENT: 25 minutes.   POSSIBLE D/C IN 2-3 DAYS, DEPENDING ON CLINICAL CONDITION.   Henreitta Leber M.D on 10/22/2015 at 4:26 PM  Between 7am to 6pm - Pager - [phone]  After 6pm go to [email or web address] - password [email or web address] [facility]  Office  [phone]  CC: Primary care physician; Marden Noble, MD

## 2015-10-22 NOTE — Evaluation (Signed)
Objective Swallowing Evaluation: Type of Study: Bedside Swallow Evaluation  Patient Details  Name: [name] [name] MRN: [id number] Date of Birth: July 19, 1920  Today's Date: 10/22/2015 Time: SLP Start Time (ACUTE ONLY): 1100-SLP Stop Time (ACUTE ONLY): 1200 SLP Time Calculation (min) (ACUTE ONLY): 60 min  Past Medical History:  Past Medical History  Diagnosis Date  . Hypertension   . GERD (gastroesophageal reflux disease)   . CHF (congestive heart failure) (HCC)   . CAD (coronary artery disease)   . Cellulitis 03/2015   Past Surgical History:  Past Surgical History  Procedure Laterality Date  . Rib fracture surgery    . Coronary artery bypass graft     HPI: Pt is a 82 y.o. female with a known history of hypertension, GERD, history of CHF, history of coronary artery disease, hypothyroidism who presents to the hospital due to fever and shortness of breath. Patient presents from a skilled nursing facility as she was noted to have a low-grade fever and also noted to be short of breath and noted to be hypoxic with O2 sats in the 70s to 80s. Patient was sent to the ER for further evaluation and noted to be hypotensive with systolic blood pressures in the 70s and also a fever of 100.2. Patient's chest x-ray findings are suggestive of a right-sided pneumonia in the right mid and lower lobes. Patient is being admitted for code sepsis secondary to underlying pneumonia. Patient incidentally was also noted to have an elevated troponin of 3 but denies any acute chest pain. She's also been having some persistent nausea and vomiting now for the past couple days; he denied any abdominal pain, diarrhea. Pt reports that her meds are "in puree" at the NH. She denies any trouble swallowing but says that she does not like the puree foods she gets at the NH; a therapist is working on upgrading her diet per pt's description(green beans, ground meats). Pt states she likes ice cream; and she gets a "supplement shake" at the  NH per her report.  Subjective: noted congested breathing at baseline prior to po's  Subjective: Patient behavior: (alertness, ability to follow instructions, etc.): Patient is alert and oriented to swallowing.  Chief complaint: Right sided pneumonia and coughing with and without POs   Objective:  Radiological Procedure: A videoflouroscopic evaluation of oral-preparatory, reflex initiation, and pharyngeal phases of the swallow was performed; as well as a screening of the upper esophageal phase.  I. POSTURE: Upright in MBS chair  II. VIEW: Lateral  III. COMPENSATORY STRATEGIES: small bolus thin- no laryngeal penetration  IV. BOLUSES ADMINISTERED:   Thin Liquid: 2 1/2 teaspoon presentation, 1 self-administered cup rim sip   Nectar-thick Liquid: 2 small cup rim sips, 3 natural drinking ("finish up the rest of it")    Puree: 2 teaspoon presentations   Mechanical Soft: 1/4 graham cracker in applesauce  V. RESULTS OF EVALUATION: A. ORAL PREPARATORY PHASE: (The lips, tongue, and velum are observed for strength and coordination) Anterior munch pattern secondary lack of dentition       **Overall Severity Rating: Min  B. SWALLOW INITIATION/REFLEX: (The reflex is normal if "triggered" by the time the bolus reached the base of the tongue)  triggers at the valleculae with solids and nectar-thick liquids and while falling from the valleculae to the pyriform sinuses with thin liquids  **Overall Severity Rating: Mild-moderate  C. PHARYNGEAL PHASE: (Pharyngeal function is normal if the bolus shows rapid, smooth, and continuous transit through the pharynx and there is no  pharyngeal residue after the swallow) reduced pharyngeal pressure generation (reduced tongue base retraction and hyolaryngeal movement) with resultant mild vallecular residue.   **Overall Severity Rating: Mild  D. LARYNGEAL PENETRATION: (Material entering into the laryngeal inlet/vestibule but not aspirated) X1 with  self-administered cup rim sip of thin liquid- cough response  E. ASPIRATION: None observed during this study  F. ESOPHAGEAL PHASE: (Screening of the upper esophagus) : no abnormalities within the viewable cervical esophagus   ASSESSMENT: 82 year old woman, admitted 10/17/2015 with shortness of breath and CXR suggestive of right mid and lower lobe pneumonia, is presenting with mild oropharyngeal swallowing.  Oral control of the bolus including oral hold and anterior to posterior transfer is within functional limits. The patient is missing some teeth having an impact on efficiency of mastication.  Timing of the pharyngeal swallow is delayed, triggering at the valleculae with solids and nectar-thick liquids and while falling from the valleculae to the pyriform sinuses with thin liquids.  There is reduced pharyngeal pressure generation (reduced tongue base retraction and hyolaryngeal movement) with resultant mild vallecular residue.  There is no observed laryngeal penetration or tracheal aspiration of solids, nectar-thick liquid, and  teaspoon boluses thin liquid.  With cup rims sips of thin liquid, there was deep laryngeal penetration with residue on top of the vocal cords.  There was a cough response.  This study supports a Dysphagia II diet with nectar-thick liquids and ice chips between meals (after oral care).  Of note, the patient had a wet cough prior to starting the study and periodically throughout the study despite no observed food/liquid entering the airway (until the last cup rim sip of water).  Once the patient is no longer coughing due to pneumonia, a cough can be an indication of aspiration for clinical assessment of diet upgrade.  PLAN/RECOMMENDATIONS:   A. Diet: Dysphagia II with nectar-thick liquids   B. Swallowing Precautions: Standard   C. Recommended consultation to N/A   D. Therapy recommendations: follow up by SLP at discharge facility   E. Results and recommendations were  discussed with the patient and the inpatient speech therapist immediately following the study  CHL IP CLINICAL IMPRESSIONS 10/20/2015  Therapy Diagnosis --  Clinical Impression --  Impact on safety and function (No Data)      CHL IP TREATMENT RECOMMENDATION 10/20/2015  Treatment Recommendations Therapy as outlined in treatment plan below     Prognosis 10/20/2015  Prognosis for Safe Diet Advancement Fair  Barriers to Reach Goals --  Barriers/Prognosis Comment --    No flowsheet data found.    CHL IP OTHER RECOMMENDATIONS 10/20/2015  Recommended Consults --  Oral Care Recommendations --  Other Recommendations Order thickener from pharmacy;Prohibited food (jello, ice cream, thin soups);Remove water pitcher      CH[facility] IP FOLLOW UP RECOMMENDATIONS 10/20/2015  Follow up Recommendations Skilled Nursing facility      [facility] IP FREQUENCY AND DURATION 10/20/2015  Speech Therapy Frequency (ACUTE ONLY) min 3x week  Treatment Duration 2 weeks           No flowsheet data found.  No flowsheet data found.   No flowsheet data found.  No flowsheet data found.   Dollene Primrose, MS/CCC- SLP  Leandrew Koyanagi 10/22/2015, 12:53 PM

## 2015-10-22 NOTE — Progress Notes (Signed)
Speech Language Pathology Treatment: Dysphagia  Patient Details Name: [name] [name] MRN: [id number] DOB: 12/12/1919 Today's Date: 10/22/2015 Time: 0930-1010 SLP Time Calculation (min) (ACUTE ONLY): 40 min  Assessment / Plan / Recommendation Clinical Impression  Pt continues to present w/ inconsistent, overt coughing w/ po trials of any consistency. She does demo. Fairly adequate oral phase management w/ the increased to a Dys. Level 2 diet w/ no c/o difficulty w/ oral management or swallowing of such. Pt does drink the Nectar liquids via cup w/ no immediate, overt s/s of aspiration, however, inconsistent (congested) coughing noted b/t sips and bites. Pt is at increased risk for aspiration d/t declined medical and respiratory status'. Due to pt's declined respiratory status and continued need for increased O2 support, rec. F/u w/ MBSS to objectively assess swallow function. MD consulted and agreed. ST will f/u today.     HPI HPI: Pt is a 82 y.o. female with a known history of hypertension, GERD, history of CHF, history of coronary artery disease, hypothyroidism who presents to the hospital due to fever and shortness of breath. Patient presents from a skilled nursing facility as she was noted to have a low-grade fever and also noted to be short of breath and noted to be hypoxic with O2 sats in the 70s to 80s. Patient was sent to the ER for further evaluation and noted to be hypotensive with systolic blood pressures in the 70s and also a fever of 100.2. Patient's chest x-ray findings are suggestive of a right-sided pneumonia in the right mid and lower lobes. Patient is being admitted for code sepsis secondary to underlying pneumonia. Patient incidentally was also noted to have an elevated troponin of 3 but denies any acute chest pain. She's also been having some persistent nausea and vomiting now for the past couple days; he denied any abdominal pain, diarrhea. Pt reports that her meds are "in puree" at the  NH. She denies any trouble swallowing but says that she does not like the puree foods she gets at the NH; a therapist is working on upgrading her diet per pt's description(green beans, ground meats). Pt states she likes ice cream; and she gets a "supplement shake" at the NH per her report. Pt is currently tolerating her current Dys. 1 diet w/ Nectar liquids but continues to present w/ a congested cough intermittently before/during/post po intake. Reviewed vitals and labs.      SLP Plan  MBS;Continue with current plan of care while awaiting MBSS results    Recommendations  Diet recommendations: Dysphagia 2 (fine chop);Nectar-thick liquid (monitor toleration of some level 2 foods) Liquids provided via: Cup Medication Administration: Crushed with puree Supervision: Patient able to self feed;Intermittent supervision to cue for compensatory strategies Compensations: Minimize environmental distractions;Slow rate;Small sips/bites Postural Changes and/or Swallow Maneuvers: Seated upright 90 degrees;Upright 30-60 min after meal (GERD baseline)             Oral Care Recommendations: Oral care BID;Staff/trained caregiver to provide oral care Follow up Recommendations: Skilled Nursing facility (TBD) Plan: MBS;Continue with current plan of care     GO               Jerilynn Som, MS, CCC-SLP  [name],[name] 10/22/2015, 1:19 PM

## 2015-10-23 MED ORDER — CARVEDILOL 6.25 MG PO TABS
6.2500 mg | ORAL_TABLET | Freq: Two times a day (BID) | ORAL | Status: DC
  Administered 2015-10-23 – 2015-10-24 (×2): 6.25 mg via ORAL
  Filled 2015-10-23 (×2): qty 1

## 2015-10-23 NOTE — Progress Notes (Signed)
CSW reviewed patient's chart and updated [name] [name]- admissions coordinator at [facility]. Patient's bed is still available at the facility. CSW will continue to follow and assist.  Woodroe Mode, MSW, LCSW-A Clinical Social Work Department [phone]

## 2015-10-23 NOTE — Progress Notes (Signed)
Speech Language Pathology Treatment: Dysphagia  Patient Details Name: [name] [name] MRN: [id number] DOB: 25-Sep-1919 Today's Date: 10/23/2015 Time: 1330-1410 SLP Time Calculation (min) (ACUTE ONLY): 40 min  Assessment / Plan / Recommendation Clinical Impression  Pt continues to tolerate her current dysphagia diet level 2 w/ Nectar consistency liquids w/ no reported overt s/s of aspiration. Pt exhibited toleration of po trials at bedside; required min.+ verbal cues and model to follow through w/ strategies of f/u (2nd) swallowing w/ trials and throat clear/cough at end of po's. Pt's respiratory status is improving per RT report. Pt is fairly independent w/ feeding self but would benefit from intermittent monitoring for follow through w/ aspiration precautions and strategies. Rec. Continue w/ Dys. Level 2 diet w/ nectar liquids; compensatory strategies, and aspiration precautions to lessen risk for aspiration. Rec. Further f/u post d/c to SNF for continued education on such as well as education on (pleasure)ice chip protocol/oral care; f/u MBSS in next 3-4 weeks as indicated.   HPI HPI: Pt is a 82 y.o. female with a known history of hypertension, GERD, history of CHF, history of coronary artery disease, hypothyroidism who presents to the hospital due to fever and shortness of breath. Patient presents from a skilled nursing facility as she was noted to have a low-grade fever and also noted to be short of breath and noted to be hypoxic with O2 sats in the 70s to 80s. Patient was sent to the ER for further evaluation and noted to be hypotensive with systolic blood pressures in the 70s and also a fever of 100.2. Patient's chest x-ray findings are suggestive of a right-sided pneumonia in the right mid and lower lobes. Patient is being admitted for code sepsis secondary to underlying pneumonia. Patient incidentally was also noted to have an elevated troponin of 3 but denies any acute chest pain. She's also been  having some persistent nausea and vomiting now for the past couple days; he denied any abdominal pain, diarrhea. Pt reports that her meds are "in puree" at the NH. She denies any trouble swallowing but says that she does not like the puree foods she gets at the NH; a therapist is working on upgrading her diet per pt's description(green beans, ground meats). Pt states she likes ice cream; and she gets a "supplement shake" at the NH per her report. Pt is currently tolerating her current Dysphagia diet but continues to present w/ a congested cough intermittently before/during/post po intake. Reviewed vitals and labs. Recent MBSS performed revealed oropharyngeal phase dysphagia and need for a Dys. 2 w/ Nectar consistency liquids diet; aspiration precautions. RT reported pt's respiratory status is improving per their assessment.       SLP Plan  Continue with current plan of care     Recommendations  Diet recommendations: Dysphagia 2 (fine chop);Nectar-thick liquid Liquids provided via: Cup Medication Administration: Crushed with puree Supervision: Patient able to self feed;Intermittent supervision to cue for compensatory strategies Compensations: Minimize environmental distractions;Slow rate;Small sips/bites;Multiple dry swallows after each bite/sip;Clear throat intermittently (and at end of meals) Postural Changes and/or Swallow Maneuvers: Seated upright 90 degrees;Upright 30-60 min after meal             Oral Care Recommendations: Oral care BID;Staff/trained caregiver to provide oral care Follow up Recommendations: Skilled Nursing facility (for education and toleratoin of diet) Plan: Continue with current plan of care     GO               Jerilynn Som, MS,  CCC-SLP  Watson,Katherine 10/23/2015, 3:55 PM

## 2015-10-23 NOTE — Progress Notes (Signed)
Physical Therapy Treatment Patient Details Name: [name] [name] MRN: [id number] DOB: 10-09-1919 Today's Date: 10/23/2015    History of Present Illness Patient presents with fever, shortness of breath, elevated troponins indicative of NSTEMI, O2 sats in 70s-80s, found to have R sided pneumonia.     PT Comments    Pt continues to demonstrate significant weakness. She requires considerable assistance with transfers and is unable to remain standing without support. Pt unable to ambulate at this time. Performed stand pivot transfer from bed to recliner in order allow pt to sit in recliner to improve circulation, breathing, and decrease risk of bed sores. Nursing director and RN notified that pt is  +2 for stand pivot transfer from recliner back to bed. Pt left in care of nursing director and family. Pt will benefit from skilled PT services to address deficits in strength, balance, and mobility in order to return to full function at home.    Follow Up Recommendations  SNF     Equipment Recommendations  Rolling walker with 5" wheels    Recommendations for Other Services       Precautions / Restrictions Precautions Precautions: Fall Restrictions Weight Bearing Restrictions: No    Mobility  Bed Mobility Overal bed mobility: Needs Assistance Bed Mobility: Supine to Sit;Sit to Supine     Supine to sit: Min assist     General bed mobility comments: Pt requires minA+1 to scoot toward EOB. She is mostly able to right her trunk going from supine to sitting with use of bed rails and HOB elevated  Transfers Overall transfer level: Needs assistance Equipment used: Rolling walker (2 wheeled) Transfers: Sit to/from Stand Sit to Stand: Mod assist         General transfer comment: Pt with decreased LE strength and power during transfers. Requires blocking of knees. Practiced sit to stand x 2. Performed with rolling walker for first attempt. Subsequent attempts performed with therapist in  front of patient providing support from gait belt and blocking knees. Performed standing marches with patient x 10 bilateral. Pt relies on bed on back of knees for support and difficulty with anterior weight shifting. Unsafe/unable to attempt ambulation at this time due to poor balance and LE weakness. Stand pivot transfer performed from bed to recliner with patient.  Ambulation/Gait             General Gait Details: Unable at this time   Stairs            Wheelchair Mobility    Modified Rankin (Stroke Patients Only)       Balance Overall balance assessment: Needs assistance Sitting-balance support: No upper extremity supported Sitting balance-Leahy Scale: Fair     Standing balance support: Bilateral upper extremity supported Standing balance-Leahy Scale: Poor                      Cognition Arousal/Alertness: Awake/alert Behavior During Therapy: WFL for tasks assessed/performed Overall Cognitive Status: History of cognitive impairments - at baseline (Appears to have age appropriate cognitive deficits.)                      Exercises General Exercises - Lower Extremity Long Arc Quad: Strengthening;Both;Seated;15 reps (2 sets) Heel Slides: Strengthening;Both;15 reps;Seated (2 sets) Hip ABduction/ADduction: Strengthening;Both;15 reps;Seated (2 sets) Hip Flexion/Marching: Strengthening;Both;15 reps;Seated (2 sets) Heel Raises: Strengthening;Both;15 reps;Seated (2 sets)    General Comments        Pertinent Vitals/Pain Pain Assessment: Faces Faces Pain Scale:  Hurts little more Pain Location: Pt complains of "moderate" low back pain due to position in bed. Unable to rate pain on NPRS Pain Intervention(s): Monitored during session    Home Living                      Prior Function            PT Goals (current goals can now be found in the care plan section) Acute Rehab PT Goals Patient Stated Goal: To increase her mobility.  PT  Goal Formulation: With patient Time For Goal Achievement: 14-Nov-2015 Potential to Achieve Goals: Good    Frequency  Min 2X/week    PT Plan Current plan remains appropriate    Co-evaluation             End of Session Equipment Utilized During Treatment: Gait belt Activity Tolerance: Patient tolerated treatment well Patient left: with call bell/phone within reach;in chair;with chair alarm set;with family/visitor present;with nursing/sitter in room     Time: 1610-9604 PT Time Calculation (min) (ACUTE ONLY): 30 min  Charges:  $Therapeutic Exercise: 8-22 mins $Therapeutic Activity: 8-22 mins                    G Codes:      [name] [name] [name] [name] PT, DPT   [name] [name] 10/23/2015, 2:21 PM

## 2015-10-23 NOTE — Progress Notes (Signed)
. [facility] [place] NAME: [name] [name]    MR#:  [id number]  DATE OF BIRTH:  1920/02/05  SUBJECTIVE:   Pt. Here due to acute resp. Failure due to pneumonia.  Upper airway rattling and shortness of breath has improved. Tolerating dysphagia 2 diet.  Afebrile.     REVIEW OF SYSTEMS:    Review of Systems  Constitutional: Negative for fever and chills.  HENT: Negative for congestion and tinnitus.   Eyes: Negative for blurred vision and double vision.  Respiratory: Positive for cough and shortness of breath ( improved). Negative for wheezing.   Cardiovascular: Negative for chest pain, orthopnea and PND.  Gastrointestinal: Negative for nausea, vomiting, abdominal pain and diarrhea.  Genitourinary: Negative for dysuria and hematuria.  Neurological: Negative for dizziness, sensory change and focal weakness.  All other systems reviewed and are negative.   Nutrition: Dysphagia 2 with nectar thick liquids Tolerating Diet: Yes but little.  Tolerating PT: Eval Noted.    DRUG ALLERGIES:  No Known Allergies  VITALS:  Blood pressure 156/79, pulse 33, temperature 97.7 F (36.5 C), temperature source Oral, resp. rate 18, height _0  (1.626 m), weight 44.906 kg (99 lb), SpO2 84 %.  PHYSICAL EXAMINATION:   Physical Exam  GENERAL:  82 y.o.-year-old cachectic patient sitting up in chair in no acute distress.  EYES: Pupils equal, round, reactive to light and accommodation. No scleral icterus. Extraocular muscles intact.  HEENT: Head atraumatic, normocephalic. Oropharynx and nasopharynx clear.  NECK:  Supple, no jugular venous distention. No thyroid enlargement, no tenderness.  LUNGS: good a/e b/l. Diffuse rhonchi bilaterally. Negative use of accessory muscles.+ upper airway congestion improved. CARDIOVASCULAR: S1, S2 normal. No murmurs, rubs, or gallops.  ABDOMEN: Soft, nontender, nondistended. Bowel sounds present. No organomegaly or mass.   EXTREMITIES: No cyanosis, clubbing or edema b/l.    NEUROLOGIC: Cranial nerves II through XII are intact. No focal Motor or sensory deficits b/l.  Globally weak. PSYCHIATRIC: The patient is alert and oriented x 3.  SKIN: No obvious rash, lesion, or ulcer.    LABORATORY PANEL:   CBC  Recent Labs Lab 10/22/15 0456  WBC 4.9  HGB 10.0*  HCT 31.4*  PLT 228   ------------------------------------------------------------------------------------------------------------------  Chemistries   Recent Labs Lab 10/17/15 0940  10/19/15 0428  10/22/15 0456  NA 133*  < > 141  --  145  K 4.3  < > 3.7  --  3.0*  CL 98*  < > 110  --  110  CO2 23  < > 19*  --  29  GLUCOSE 115*  < > 53*  --  104*  BUN 34*  < > 33*  --  13  CREATININE 1.30*  < > 0.99  < > 0.66  CALCIUM 8.9  < > 8.5*  --  8.8*  MG  --   --  1.7  --   --   AST 72*  --   --   --   --   ALT 31  --   --   --   --   ALKPHOS 121  --   --   --   --   BILITOT 0.4  --   --   --   --   < > = values in this interval not displayed. ------------------------------------------------------------------------------------------------------------------  Cardiac Enzymes  Recent Labs Lab 10/17/15 2011  TROPONINI 6.96*   ------------------------------------------------------------------------------------------------------------------  RADIOLOGY:  No results found.  ASSESSMENT AND PLAN:   82 year old female with past medical history of hypertension, hyperlipidemia, hypothyroidism, GERD, history of CHF who presents to the hospital due to shortness of breath, fever and noted to be septic.  #1 sepsis-patient met criteria on admission given her fever, tachypnea, tachycardia and chest x-ray findings suggestive of a right mid and lower lobe pneumonia. -off vasopressors and Hemodynamically stable. Finished 7 days of Zosyn and doing well.  - cultures (-) so far.  #2 pneumonia-suspect aspiration pneumonia. -Finished 7 days of zosyn and has  improved. sputum and blood cultures are (-) so far.  -Appreciate speech evaluation  Diet advanced to dysphagia 2 with honey thick liquids and tolerating it.   #3 acute respiratory failure with hypoxia-secondary to pneumonia. -Continue supportive care with O2 supplementation, IV antibiotics and pulmonary toileting. - Continue flutter valve, chest physiotherapy and improving.   #4 non-ST elevation MI-patients troponin peaked as high as 8. Clinically patient has had no chest pain -off heparin gtt now.  Cont. ASA, Coreg, Statin, ACE.  -Seen by cardiology and continue current care. Echocardiogram shows worsening EF of 20-25%.   -Given her advanced age and comorbidities she is not a candidate for aggressive intervention.  #5 acute renal failure-likely due to ATN secondary to sepsis.  - Cr. Back to baseline. Off IV fluids.  Encourage PO intake.  Follow BUN/Cr.   #6 hypothyroidism-continue Synthroid.  #7 GERD-continue Protonix.  PT eval noted and possible back to [facility] tomorrow.   All the records are reviewed and case discussed with Care Management/Social Workerr. Management plans discussed with the patient, family and they are in agreement.  CODE STATUS: DO NOT RESUSCITATE  DVT Prophylaxis: Lovenox  TOTAL TIME TAKING CARE OF THIS PATIENT: 25 minutes.   POSSIBLE D/C IN 2-3 DAYS, DEPENDING ON CLINICAL CONDITION.   Henreitta Leber M.D on 10/23/2015 at 5:01 PM  Between 7am to 6pm - Pager - [phone]  After 6pm go to [email or web address] - password [email or web address] [place] [facility]  Office  [phone]  CC: Primary care physician; Marden Noble, MD

## 2015-10-23 NOTE — Plan of Care (Signed)
Problem: SLP Dysphagia Goals Goal: Misc Dysphagia Goal Pt will safely tolerate po diet of least restrictive consistency w/ no overt s/s of aspiration noted by Staff/pt/family x3 sessions.    

## 2015-10-24 LAB — BASIC METABOLIC PANEL
Anion gap: 6 (ref 5–15)
BUN: 10 mg/dL (ref 6–20)
CALCIUM: 8.9 mg/dL (ref 8.9–10.3)
CO2: 33 mmol/L — AB (ref 22–32)
CREATININE: 0.62 mg/dL (ref 0.44–1.00)
Chloride: 106 mmol/L (ref 101–111)
GFR calc Af Amer: >60 mL/min (ref >60)
GLUCOSE: 105 mg/dL — AB (ref 65–99)
Potassium: 3.6 mmol/L (ref 3.5–5.1)
Sodium: 145 mmol/L (ref 135–145)

## 2015-10-24 LAB — CULTURE, RESPIRATORY
CULTURE: NORMAL
SPECIAL REQUESTS: NORMAL

## 2015-10-24 LAB — CULTURE, RESPIRATORY W GRAM STAIN

## 2015-10-24 MED ORDER — CARVEDILOL 6.25 MG PO TABS: 6.2500 mg | ORAL_TABLET | Freq: Two times a day (BID) | ORAL | Status: AC

## 2015-10-24 MED ORDER — AMOXICILLIN-POT CLAVULANATE 500-125 MG PO TABS: 1.0000 | ORAL_TABLET | Freq: Three times a day (TID) | ORAL | Status: AC

## 2015-10-24 NOTE — Progress Notes (Signed)
EMS here to pick pt up for transport to [facility]

## 2015-10-24 NOTE — Discharge Summary (Signed)
[name] [name]    MR#:  [id number]  DATE OF BIRTH:  1919-11-03  DATE OF ADMISSION:  10/17/2015 ADMITTING PHYSICIAN: Henreitta Leber, MD  DATE OF DISCHARGE: 10/24/2015  PRIMARY CARE PHYSICIAN: Marden Noble, MD    ADMISSION DIAGNOSIS:  Respiratory failure ([place]) [J96.90] Healthcare-associated pneumonia [J18.9]  DISCHARGE DIAGNOSIS:  Active Problems:   Sepsis ([place])   Pressure ulcer   SECONDARY DIAGNOSIS:   Past Medical History  Diagnosis Date  . Hypertension   . GERD (gastroesophageal reflux disease)   . CHF (congestive heart failure) ([place])   . CAD (coronary artery disease)   . Cellulitis 03/2015    HOSPITAL COURSE:   82 year old female with past medical history of hypertension, hyperlipidemia, hypothyroidism, GERD, history of CHF who presents to the hospital due to shortness of breath, fever and noted to be septic.  #1 sepsis-patient met criteria on admission given her fever, tachypnea, tachycardia and chest x-ray findings suggestive of a right mid and lower lobe pneumonia. -Patient was treated aggressively with broad-spectrum IV antibiotics with vancomycin, Zosyn. She was then taken off the vancomycin and is maintained on Zosyn. She has finished 7 days of IV antibiotic therapy and has improved. She will discharge on few more days of oral Augmentin. -She has been afebrile, hemodynamically stable off vasopressors..  #2 pneumonia-this was aspiration pneumonia. -Patient was treated with 7 days of IV Zosyn and is now being discharged on oral Augmentin. -Patient was also seen by speech and initially placed on a dysphagia 1 diet and advanced to dysphagia 2 with honey thick liquids. Continue strict aspiration precautions. Patient is a high risk for re-aspiration.  #3 acute respiratory failure with hypoxia-this was secondary to pneumonia. Patient has been treated adequately for the pneumonia and her O2  requirements have improved. -Continue aggressive pulmonary toileting.  She received chest physiotherapy and we'll continue her flutter valve.  #4 non-ST elevation MI-patients troponin peaked as high as 8. Clinically patient has had no chest pain -Patient was started on a heparin drip and maintained on it for about 48-72 hours. She will continue aspirin, Coreg, statin, ACE inhibitor. She was seen by cardiology and did not recommend acute intervention given advanced age and comorbid disease. -Seen by cardiology and continue current care. Echocardiogram shows worsening EF of 20-25%.   #5 acute renal failure-this was ATN due to sepsis. -After IV fluids patient's creatinine is now back to baseline.    #6 hypothyroidism-she will continue Synthroid.  #7 GERD-she will resume her omeprazole.  DISCHARGE CONDITIONS:   Stable.   CONSULTS OBTAINED:  Treatment Team:  Corey Skains, MD  DRUG ALLERGIES:  No Known Allergies  DISCHARGE MEDICATIONS:   Current Discharge Medication List    START taking these medications   Details  amoxicillin-clavulanate (AUGMENTIN) 500-125 MG tablet Take 1 tablet (500 mg total) by mouth 3 (three) times daily.    carvedilol (COREG) 6.25 MG tablet Take 1 tablet (6.25 mg total) by mouth 2 (two) times daily with a meal.      CONTINUE these medications which have NOT CHANGED   Details  antiseptic oral rinse (CPC / CETYLPYRIDINIUM CHLORIDE 0.05%) 0.05 % LIQD solution 7 mLs by Mouth Rinse route 2 times daily at 12 noon and 4 pm. Qty: 118 mL, Refills: 0    aspirin EC 81 MG tablet Take 1 tablet (81 mg total) by mouth daily. Qty: 30 tablet, Refills: 0  cetirizine (ZYRTEC) 10 MG tablet Take 10 mg by mouth daily as needed for allergies.     chlorhexidine (PERIDEX) 0.12 % solution 15 mLs by Mouth Rinse route 2 (two) times daily. Qty: 120 mL, Refills: 0    feeding supplement, ENSURE ENLIVE, (ENSURE ENLIVE) LIQD Take 237 mLs by mouth 2 (two) times daily with a  meal. Qty: 237 mL, Refills: 12    furosemide (LASIX) 20 MG tablet Take 1 tablet (20 mg total) by mouth daily. Qty: 30 tablet, Refills: 0    levothyroxine (SYNTHROID, LEVOTHROID) 25 MCG tablet Take 1 tablet (25 mcg total) by mouth daily before breakfast. Qty: 30 tablet, Refills: 5    mupirocin ointment (BACTROBAN) 2 % Place into the nose 2 (two) times daily. Qty: 22 g, Refills: 0    omeprazole (PRILOSEC) 20 MG capsule Take 20 mg by mouth daily.     potassium chloride (K-DUR,KLOR-CON) 10 MEQ tablet Take 10 mEq by mouth daily.     quinapril (ACCUPRIL) 20 MG tablet Take 20 mg by mouth daily.     simvastatin (ZOCOR) 40 MG tablet Take 40 mg by mouth daily.          DISCHARGE INSTRUCTIONS:   DIET:  Cardiac diet.  Dysphagia II diet with Honey thick liquids and Strict Aspiration Precautions.   DISCHARGE CONDITION:  Stable  ACTIVITY:  Activity as tolerated  OXYGEN:  Home Oxygen: No.   Oxygen Delivery: room air  DISCHARGE LOCATION:  nursing home   If you experience worsening of your admission symptoms, develop shortness of breath, life threatening emergency, suicidal or homicidal thoughts you must seek medical attention immediately by calling 911 or calling your MD immediately  if symptoms less severe.  You Must read complete instructions/literature along with all the possible adverse reactions/side effects for all the Medicines you take and that have been prescribed to you. Take any new Medicines after you have completely understood and accpet all the possible adverse reactions/side effects.   Please note  You were cared for by a hospitalist during your hospital stay. If you have any questions about your discharge medications or the care you received while you were in the hospital after you are discharged, you can call the unit and asked to speak with the hospitalist on call if the hospitalist that took care of you is not available. Once you are discharged, your primary care  physician will handle any further medical issues. Please note that NO REFILLS for any discharge medications will be authorized once you are discharged, as it is imperative that you return to your primary care physician (or establish a relationship with a primary care physician if you do not have one) for your aftercare needs so that they can reassess your need for medications and monitor your lab values.     Today   Still has some upper airway rattling. No fever, blood pressure stable.  VITAL SIGNS:  Blood pressure 148/76, pulse 123, temperature 97.8 F (36.6 C), temperature source Oral, resp. rate 17, height 5' 4" (1.626 m), weight 44.906 kg (99 lb), SpO2 100 %.  I/O:   Intake/Output Summary (Last 24 hours) at 10/24/15 0921 Last data filed at 10/24/15 0421  Gross per 24 hour  Intake    120 ml  Output    400 ml  Net   -280 ml    PHYSICAL EXAMINATION:   GENERAL: 82 y.o.-year-old cachectic patient sitting up in chair in no acute distress.  EYES: Pupils equal, round, reactive to  light and accommodation. No scleral icterus. Extraocular muscles intact.  HEENT: Head atraumatic, normocephalic. Oropharynx and nasopharynx clear.  NECK: Supple, no jugular venous distention. No thyroid enlargement, no tenderness.  LUNGS: good a/e b/l. B/l rhonchi but improved since admission. Negative use of accessory muscles.+ upper airway congestion improved. CARDIOVASCULAR: S1, S2 normal. No murmurs, rubs, or gallops.  ABDOMEN: Soft, nontender, nondistended. Bowel sounds present. No organomegaly or mass.  EXTREMITIES: No cyanosis, clubbing or edema b/l.  NEUROLOGIC: Cranial nerves II through XII are intact. No focal Motor or sensory deficits b/l. Globally weak. PSYCHIATRIC: The patient is alert and oriented x 3.  SKIN: No obvious rash, lesion, or ulcer.   DATA REVIEW:   CBC  Recent Labs Lab 10/22/15 0456  WBC 4.9  HGB 10.0*  HCT 31.4*  PLT 228    Chemistries   Recent Labs Lab  10/17/15 0940  10/19/15 0428  10/24/15 0446  NA 133*  < > 141  < > 145  K 4.3  < > 3.7  < > 3.6  CL 98*  < > 110  < > 106  CO2 23  < > 19*  < > 33*  GLUCOSE 115*  < > 53*  < > 105*  BUN 34*  < > 33*  < > 10  CREATININE 1.30*  < > 0.99  < > 0.62  CALCIUM 8.9  < > 8.5*  < > 8.9  MG  --   --  1.7  --   --   AST 72*  --   --   --   --   ALT 31  --   --   --   --   ALKPHOS 121  --   --   --   --   BILITOT 0.4  --   --   --   --   < > = values in this interval not displayed.  Cardiac Enzymes  Recent Labs Lab 10/17/15 2011  TROPONINI 6.96*    Microbiology Results  Results for orders placed or performed during the hospital encounter of 10/17/15  Blood Culture (routine x 2)     Status: None   Collection Time: 10/17/15  9:40 AM  Result Value Ref Range Status   Specimen Description BLOOD RIGHT ANTECUBITAL  Final   Special Requests   Final    BOTTLES DRAWN AEROBIC AND ANAEROBIC  AER4ML ANA 3ML   Culture NO GROWTH 5 DAYS  Final   Report Status 10/22/2015 FINAL  Final  Rapid Influenza A&B Antigens ([place] only)     Status: None   Collection Time: 10/17/15  9:40 AM  Result Value Ref Range Status   Influenza A ([place]) NOT DETECTED  Final   Influenza B ([facility]) NOT DETECTED  Final  MRSA PCR Screening     Status: None   Collection Time: 10/17/15  3:32 PM  Result Value Ref Range Status   MRSA by PCR NEGATIVE NEGATIVE Final    Comment:        The GeneXpert MRSA Assay (FDA approved for NASAL specimens only), is one component of a comprehensive MRSA colonization surveillance program. It is not intended to diagnose MRSA infection nor to guide or monitor treatment for MRSA infections.   Blood Culture (routine x 2)     Status: None   Collection Time: 10/17/15  3:50 PM  Result Value Ref Range Status   Specimen Description BLOOD RIGHT FOREARM  Final   Special Requests BOTTLES DRAWN AEROBIC AND  ANAEROBIC  1CC  Final   Culture NO GROWTH 5 DAYS  Final   Report Status 10/22/2015 FINAL   Final  Urine culture     Status: None   Collection Time: 10/18/15 10:45 AM  Result Value Ref Range Status   Specimen Description URINE, RANDOM  Final   Special Requests NONE  Final   Culture NO GROWTH 1 DAY  Final   Report Status 10/19/2015 FINAL  Final  Culture, expectorated sputum-assessment     Status: None   Collection Time: 10/19/15  2:30 PM  Result Value Ref Range Status   Specimen Description EXPECTORATED SPUTUM  Final   Special Requests Normal  Final   Sputum evaluation THIS SPECIMEN IS ACCEPTABLE FOR SPUTUM CULTURE  Final   Report Status 10/19/2015 FINAL  Final  Culture, respiratory (NON-Expectorated)     Status: None (Preliminary result)   Collection Time: 10/19/15  2:30 PM  Result Value Ref Range Status   Specimen Description EXPECTORATED SPUTUM  Final   Special Requests Normal Reflexed from [id number]  Final   Gram Stain WAITING FOR DIRECT S TO BE ENTERED  Final   Culture Consistent with normal respiratory flora.  Final   Report Status PENDING  Incomplete    RADIOLOGY:  No results found.    Management plans discussed with the patient, family and they are in agreement.  CODE STATUS:     Code Status Orders        Start     Ordered   10/17/15 1146  Do not attempt resuscitation (DNR)   Continuous    Question Answer Comment  In the event of cardiac or respiratory ARREST Do not call a "code blue"   In the event of cardiac or respiratory ARREST Do not perform Intubation, CPR, defibrillation or ACLS   In the event of cardiac or respiratory ARREST Use medication by any route, position, wound care, and other measures to relive pain and suffering. May use oxygen, suction and manual treatment of airway obstruction as needed for comfort.      10/17/15 1145    Code Status History    Date Active Date Inactive Code Status Order ID Comments User Context   10/17/2015 11:45 AM 10/17/2015 11:45 AM Full Code [id number]  Henreitta Leber, MD ED   08/20/2015  5:18 PM 08/23/2015  7:34  PM Full Code [id number]  Loletha Grayer, MD ED   04/14/2015  6:35 PM 04/17/2015  5:42 PM Full Code [id number]  Gladstone Lighter, MD Inpatient    Advance Directive Documentation        Most Recent Value   Type of Advance Directive  Out of facility DNR (pink MOST or yellow form), Healthcare Power of Attorney   Pre-existing out of facility DNR order (yellow form or pink MOST form)  Yellow form placed in chart (order not valid for inpatient use)   "MOST" Form in Place?        TOTAL TIME TAKING CARE OF THIS PATIENT: 40 minutes.    Henreitta Leber M.D on 10/24/2015 at 9:21 AM  Between 7am to 6pm - Pager - [phone]  After 6pm go to [email or web address] - password [email or web address] [place] [facility]  Office  [phone]  CC: Primary care physician; Marden Noble, MD

## 2015-10-24 NOTE — Care Management Important Message (Signed)
Important Message  Patient Details  Name: [name] [name] MRN: [id number] Date of Birth: 19-Oct-1919   Medicare Important Message Given:  Yes    Olegario MessierKathy A Wilmont Olund 10/24/2015, 10:06 AM

## 2015-10-24 NOTE — Progress Notes (Signed)
Clinical Social Worker was informed that [facility] be [facility] Patient and William HamburgerMelinda Menz are in a agreement with plan. [facility]. Provided patient's room number 87 A and number to call for report [phone]. [facility] DNR added to discharge packet.   [facility], [facility]. RN will call report and [facility].  Woodroe Modehristina Deshone Lyssy, MSW, LCSW-A Clinical Social Work Department [phone]

## 2015-10-24 NOTE — Progress Notes (Signed)
Report called to Wilnette Kaleshelma, RN at [facility].

## 2015-10-24 NOTE — Plan of Care (Signed)
Problem: Activity: Goal: Risk for activity intolerance will decrease Outcome: Progressing PT workings with pt

## 2015-10-24 NOTE — Progress Notes (Signed)
EMS called for non emergent transport to [facility].

## 2015-10-24 NOTE — Progress Notes (Signed)
Attempted to call report to [facility], RN at lunch.  Will call back

## 2015-11-22 DEATH — deceased

## 2015-11-24 ENCOUNTER — Ambulatory Visit: Payer: Medicare Other | Admitting: Family

## 2016-06-30 IMAGING — DX DG CHEST 1V PORT
1 series · 2 of 2 positions shown · non-contrast
Comparison: 10/17/2015 and prior exams

CLINICAL DATA: [AGE] female with respiratory failure.

EXAM:
PORTABLE CHEST 1 VIEW

[Series 1: chest ap · 0.14mm/px · 2 of 2 slices shown]
[im 1/2]
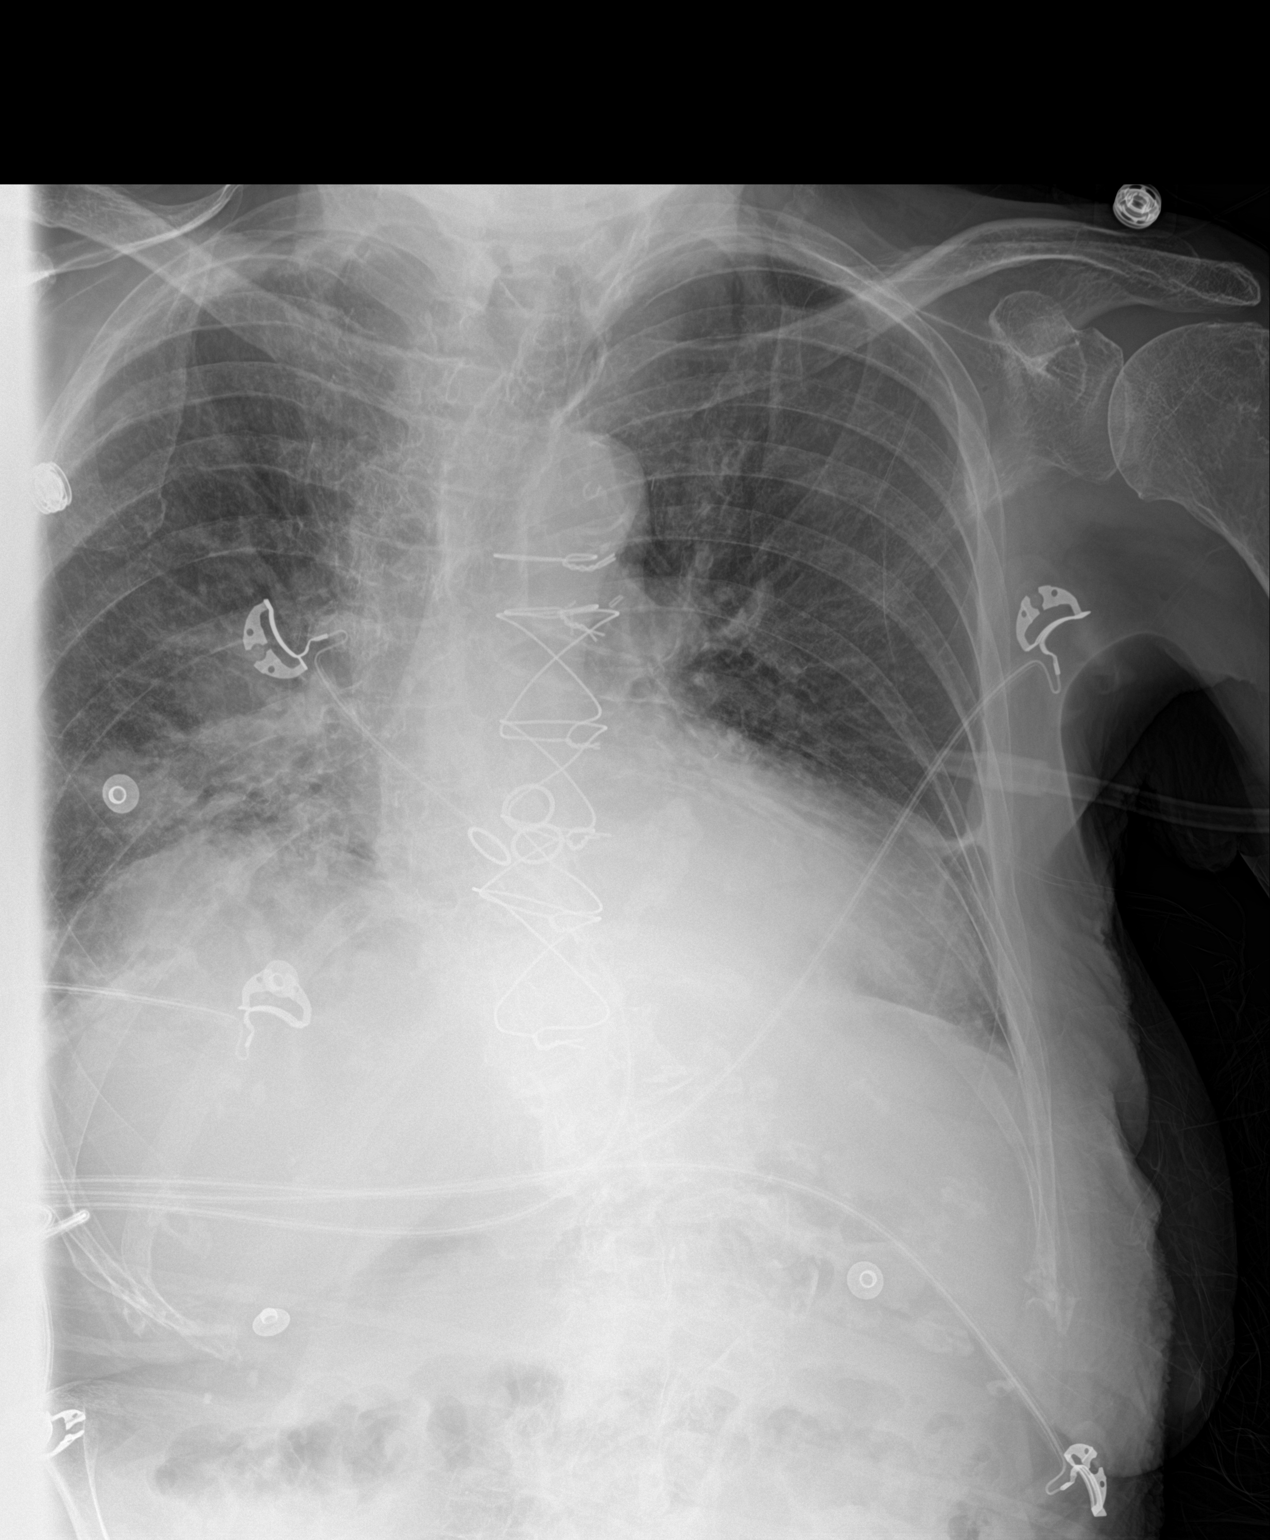
[im 2/2]
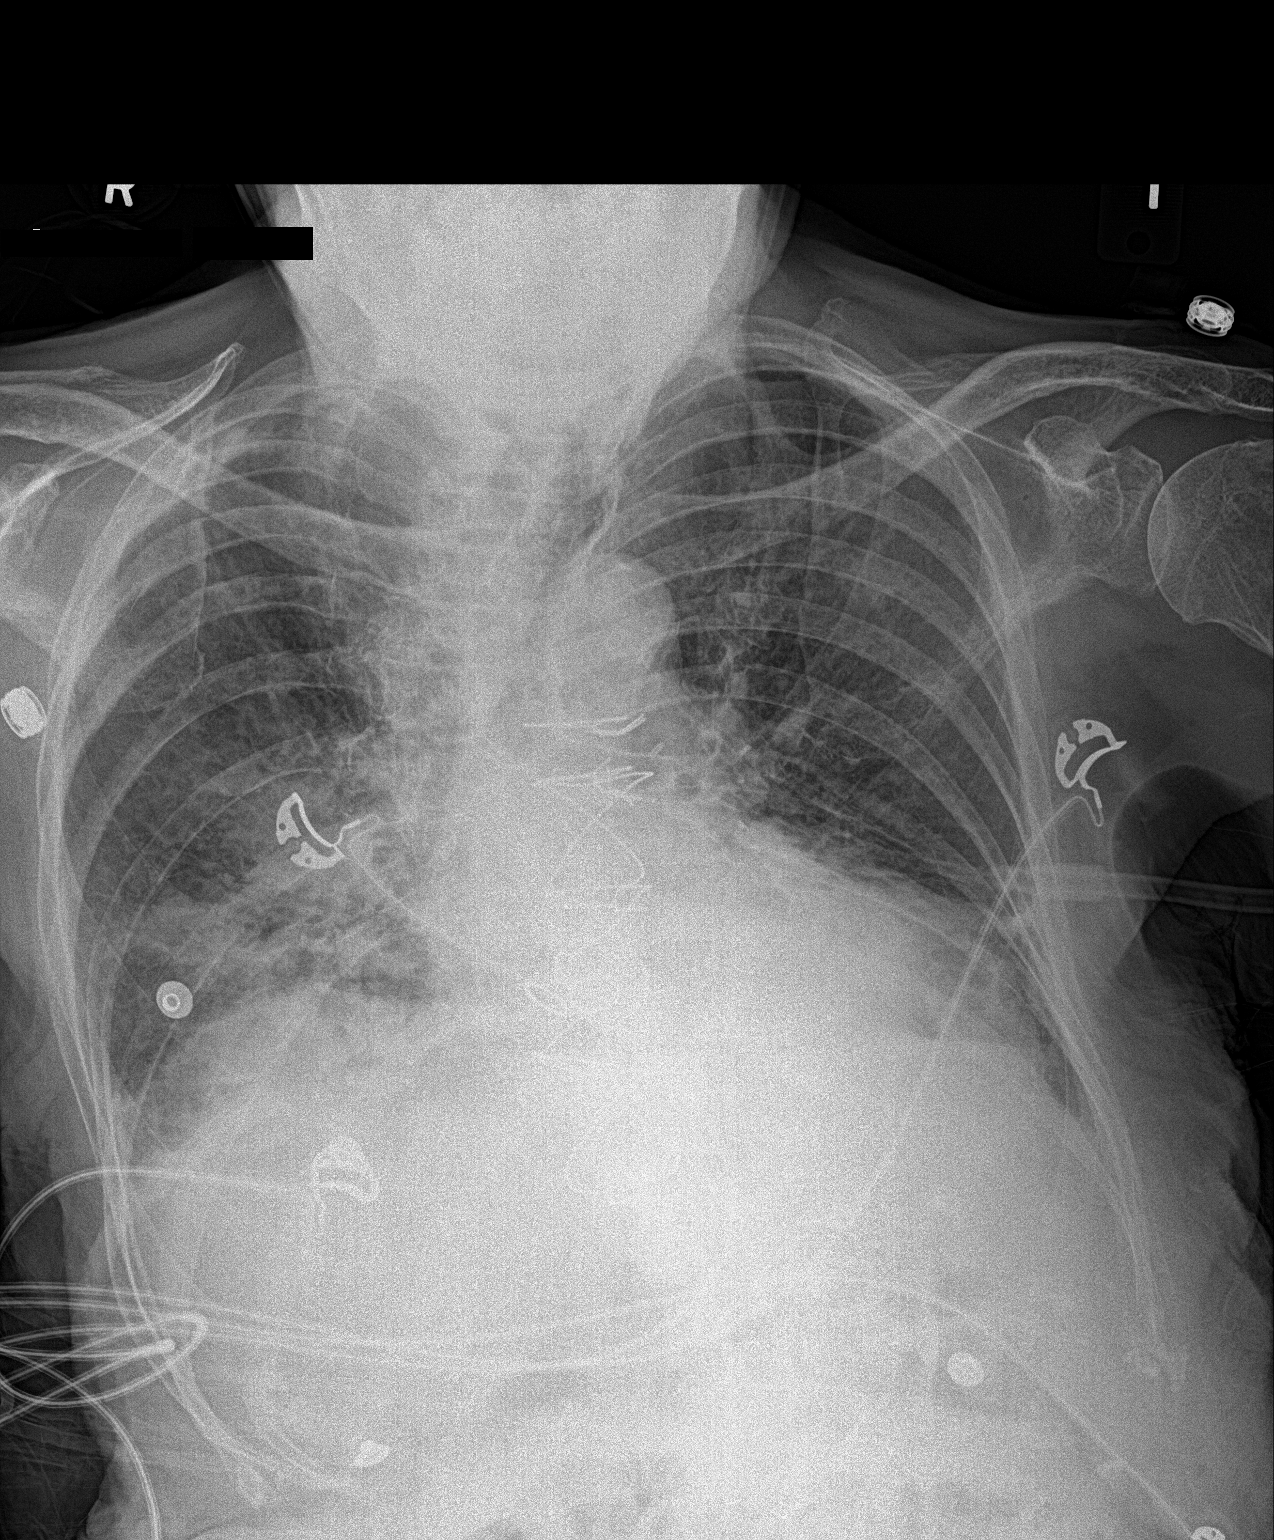

[2 of 2 positions shown; findings below may reference images not displayed]

FINDINGS: Cardiomediastinal silhouette is unchanged. CABG changes are present.

Decreased pulmonary vascular congestion noted.

Bilateral lower lung atelectasis/consolidation again noted.

There is no evidence of pneumothorax.

No other changes are identified.
IMPRESSION: Decreased pulmonary vascular congestion with continued bilateral
lower lung atelectasis/ consolidation.
